# Patient Record
Sex: Female | Born: 1950 | Race: Black or African American | Hispanic: No | Marital: Single | Smoking: Former smoker
Health system: Southern US, Community
[De-identification: ages and names within clinical notes are randomized; demographics above are authoritative.]

## PROBLEM LIST (undated history)

## (undated) DIAGNOSIS — R748 Abnormal levels of other serum enzymes: Secondary | ICD-10-CM

## (undated) DIAGNOSIS — M351 Other overlap syndromes: Secondary | ICD-10-CM

## (undated) DIAGNOSIS — M1990 Unspecified osteoarthritis, unspecified site: Secondary | ICD-10-CM

## (undated) DIAGNOSIS — I10 Essential (primary) hypertension: Secondary | ICD-10-CM

## (undated) DIAGNOSIS — I7300 Raynaud's syndrome without gangrene: Secondary | ICD-10-CM

## (undated) DIAGNOSIS — G4730 Sleep apnea, unspecified: Secondary | ICD-10-CM

## (undated) DIAGNOSIS — K900 Celiac disease: Secondary | ICD-10-CM

## (undated) DIAGNOSIS — D649 Anemia, unspecified: Secondary | ICD-10-CM

## (undated) DIAGNOSIS — E669 Obesity, unspecified: Secondary | ICD-10-CM

## (undated) DIAGNOSIS — IMO0002 Reserved for concepts with insufficient information to code with codable children: Secondary | ICD-10-CM

## (undated) DIAGNOSIS — E785 Hyperlipidemia, unspecified: Secondary | ICD-10-CM

## (undated) DIAGNOSIS — M329 Systemic lupus erythematosus, unspecified: Secondary | ICD-10-CM

## (undated) HISTORY — PX: ABDOMINAL HYSTERECTOMY: SHX81

---

## 2007-07-05 ENCOUNTER — Ambulatory Visit: Payer: Self-pay

## 2008-07-11 ENCOUNTER — Ambulatory Visit: Payer: Self-pay | Admitting: Internal Medicine

## 2008-12-26 ENCOUNTER — Ambulatory Visit: Payer: Self-pay | Admitting: Internal Medicine

## 2009-04-14 ENCOUNTER — Ambulatory Visit: Payer: Self-pay | Admitting: Internal Medicine

## 2010-06-09 ENCOUNTER — Ambulatory Visit: Payer: Self-pay | Admitting: Internal Medicine

## 2010-08-23 ENCOUNTER — Ambulatory Visit: Payer: Self-pay | Admitting: Unknown Physician Specialty

## 2011-06-22 ENCOUNTER — Ambulatory Visit: Payer: Self-pay | Admitting: Internal Medicine

## 2012-06-29 ENCOUNTER — Ambulatory Visit: Payer: Self-pay | Admitting: Internal Medicine

## 2012-09-19 ENCOUNTER — Ambulatory Visit: Payer: Self-pay | Admitting: Internal Medicine

## 2012-10-22 ENCOUNTER — Ambulatory Visit: Payer: Self-pay | Admitting: Internal Medicine

## 2012-10-22 LAB — FERRITIN: Ferritin (ARMC): 209 ng/mL (ref 8–388)

## 2012-10-22 LAB — CBC CANCER CENTER
HCT: 39.1 % (ref 35.0–47.0)
Lymphocytes: 26 %
MCH: 31.5 pg (ref 26.0–34.0)
MCHC: 33.2 g/dL (ref 32.0–36.0)
MCV: 95 fL (ref 80–100)
Monocytes: 17 %
Platelet: 188 x10 3/mm (ref 150–440)
RBC: 4.12 10*6/uL (ref 3.80–5.20)
RDW: 14 % (ref 11.5–14.5)
Variant Lymphocyte: 4 %
WBC: 4.1 x10 3/mm (ref 3.6–11.0)

## 2012-10-22 LAB — IRON AND TIBC: Iron: 112 ug/dL (ref 50–170)

## 2012-10-22 LAB — RETICULOCYTES
Absolute Retic Count: 0.0473 10*6/uL (ref 0.019–0.186)
Reticulocyte: 1.15 % (ref 0.4–3.1)

## 2012-10-22 LAB — PROTIME-INR: Prothrombin Time: 13.3 secs (ref 11.5–14.7)

## 2012-10-23 LAB — PROT IMMUNOELECTROPHORES(ARMC)

## 2012-10-24 ENCOUNTER — Ambulatory Visit: Payer: Self-pay | Admitting: Internal Medicine

## 2012-11-24 ENCOUNTER — Ambulatory Visit: Payer: Self-pay | Admitting: Internal Medicine

## 2013-04-30 DIAGNOSIS — M351 Other overlap syndromes: Secondary | ICD-10-CM | POA: Diagnosis present

## 2013-04-30 DIAGNOSIS — K900 Celiac disease: Secondary | ICD-10-CM | POA: Insufficient documentation

## 2013-04-30 DIAGNOSIS — D649 Anemia, unspecified: Secondary | ICD-10-CM | POA: Insufficient documentation

## 2013-05-06 ENCOUNTER — Ambulatory Visit: Payer: Self-pay | Admitting: Internal Medicine

## 2013-05-07 LAB — CBC CANCER CENTER
BASOS ABS: 0 x10 3/mm (ref 0.0–0.1)
BASOS PCT: 0.7 %
EOS PCT: 0 %
Eosinophil #: 0 x10 3/mm (ref 0.0–0.7)
HCT: 35.3 % (ref 35.0–47.0)
HGB: 11.4 g/dL — ABNORMAL LOW (ref 12.0–16.0)
LYMPHS ABS: 1.2 x10 3/mm (ref 1.0–3.6)
LYMPHS PCT: 26.6 %
MCH: 31.1 pg (ref 26.0–34.0)
MCHC: 32.3 g/dL (ref 32.0–36.0)
MCV: 96 fL (ref 80–100)
Monocyte #: 0.9 x10 3/mm (ref 0.2–0.9)
Monocyte %: 19.5 %
Neutrophil #: 2.4 x10 3/mm (ref 1.4–6.5)
Neutrophil %: 53.2 %
PLATELETS: 191 x10 3/mm (ref 150–440)
RBC: 3.67 10*6/uL — ABNORMAL LOW (ref 3.80–5.20)
RDW: 13.9 % (ref 11.5–14.5)
WBC: 4.4 x10 3/mm (ref 3.6–11.0)

## 2013-05-24 ENCOUNTER — Ambulatory Visit: Payer: Self-pay | Admitting: Internal Medicine

## 2013-07-01 ENCOUNTER — Ambulatory Visit: Payer: Self-pay | Admitting: Internal Medicine

## 2014-05-09 ENCOUNTER — Other Ambulatory Visit: Payer: Self-pay | Admitting: Internal Medicine

## 2014-05-09 DIAGNOSIS — Z1231 Encounter for screening mammogram for malignant neoplasm of breast: Secondary | ICD-10-CM

## 2014-05-27 ENCOUNTER — Other Ambulatory Visit: Payer: Self-pay | Admitting: Rheumatology

## 2014-05-27 DIAGNOSIS — R748 Abnormal levels of other serum enzymes: Secondary | ICD-10-CM

## 2014-05-27 DIAGNOSIS — M609 Myositis, unspecified: Secondary | ICD-10-CM

## 2014-05-27 DIAGNOSIS — G729 Myopathy, unspecified: Secondary | ICD-10-CM

## 2014-07-02 ENCOUNTER — Ambulatory Visit
Admission: RE | Admit: 2014-07-02 | Discharge: 2014-07-02 | Disposition: A | Discharge status: Home / Self Care | Payer: 59 | Source: Ambulatory Visit | Attending: Rheumatology | Admitting: Rheumatology

## 2014-07-02 DIAGNOSIS — M329 Systemic lupus erythematosus, unspecified: Secondary | ICD-10-CM | POA: Diagnosis not present

## 2014-07-02 DIAGNOSIS — K5730 Diverticulosis of large intestine without perforation or abscess without bleeding: Secondary | ICD-10-CM | POA: Insufficient documentation

## 2014-07-02 DIAGNOSIS — R748 Abnormal levels of other serum enzymes: Secondary | ICD-10-CM

## 2014-07-02 DIAGNOSIS — M609 Myositis, unspecified: Secondary | ICD-10-CM

## 2014-07-02 DIAGNOSIS — G729 Myopathy, unspecified: Secondary | ICD-10-CM

## 2014-07-02 DIAGNOSIS — M25462 Effusion, left knee: Secondary | ICD-10-CM | POA: Diagnosis not present

## 2014-07-02 DIAGNOSIS — R590 Localized enlarged lymph nodes: Secondary | ICD-10-CM | POA: Insufficient documentation

## 2014-07-02 DIAGNOSIS — R7889 Finding of other specified substances, not normally found in blood: Secondary | ICD-10-CM | POA: Diagnosis present

## 2014-07-02 MED ORDER — GADOBENATE DIMEGLUMINE 529 MG/ML IV SOLN
20.0000 mL | Freq: Once | INTRAVENOUS | Status: AC | PRN
  Administered 2014-07-02: 20 mL via INTRAVENOUS

## 2014-07-04 ENCOUNTER — Ambulatory Visit: Payer: Self-pay

## 2014-07-04 ENCOUNTER — Other Ambulatory Visit: Payer: Self-pay

## 2014-07-04 ENCOUNTER — Ambulatory Visit
Admission: RE | Admit: 2014-07-04 | Discharge: 2014-07-04 | Disposition: A | Discharge status: Home / Self Care | Payer: 59 | Source: Ambulatory Visit | Attending: Internal Medicine | Admitting: Internal Medicine

## 2014-07-04 DIAGNOSIS — Z1231 Encounter for screening mammogram for malignant neoplasm of breast: Secondary | ICD-10-CM | POA: Diagnosis not present

## 2015-02-06 DIAGNOSIS — R7303 Prediabetes: Secondary | ICD-10-CM | POA: Diagnosis present

## 2015-03-10 ENCOUNTER — Other Ambulatory Visit: Payer: Self-pay | Admitting: Family Medicine

## 2015-03-10 DIAGNOSIS — Z1231 Encounter for screening mammogram for malignant neoplasm of breast: Secondary | ICD-10-CM

## 2015-07-10 ENCOUNTER — Ambulatory Visit
Admission: RE | Admit: 2015-07-10 | Discharge: 2015-07-10 | Disposition: A | Discharge status: Home / Self Care | Payer: 59 | Source: Ambulatory Visit | Attending: Family Medicine | Admitting: Family Medicine

## 2015-07-10 ENCOUNTER — Other Ambulatory Visit: Payer: Self-pay | Admitting: Family Medicine

## 2015-07-10 DIAGNOSIS — Z1231 Encounter for screening mammogram for malignant neoplasm of breast: Secondary | ICD-10-CM

## 2015-09-03 ENCOUNTER — Encounter: Payer: Self-pay | Admitting: *Deleted

## 2015-11-05 ENCOUNTER — Encounter: Payer: Self-pay | Admitting: *Deleted

## 2015-11-09 ENCOUNTER — Encounter: Payer: Self-pay | Admitting: Anesthesiology

## 2015-11-09 ENCOUNTER — Encounter
Admission: RE | Disposition: A | Discharge status: Home / Self Care | Payer: Self-pay | Source: Ambulatory Visit | Attending: Unknown Physician Specialty

## 2015-11-09 ENCOUNTER — Ambulatory Visit: Payer: 59 | Admitting: Anesthesiology

## 2015-11-09 ENCOUNTER — Ambulatory Visit
Admission: RE | Admit: 2015-11-09 | Discharge: 2015-11-09 | Disposition: A | Discharge status: Home / Self Care | Payer: 59 | Source: Ambulatory Visit | Attending: Unknown Physician Specialty | Admitting: Unknown Physician Specialty

## 2015-11-09 DIAGNOSIS — Z1211 Encounter for screening for malignant neoplasm of colon: Secondary | ICD-10-CM | POA: Insufficient documentation

## 2015-11-09 DIAGNOSIS — Z8371 Family history of colonic polyps: Secondary | ICD-10-CM | POA: Diagnosis not present

## 2015-11-09 DIAGNOSIS — Z6841 Body Mass Index (BMI) 40.0 and over, adult: Secondary | ICD-10-CM | POA: Insufficient documentation

## 2015-11-09 DIAGNOSIS — K648 Other hemorrhoids: Secondary | ICD-10-CM | POA: Insufficient documentation

## 2015-11-09 DIAGNOSIS — I739 Peripheral vascular disease, unspecified: Secondary | ICD-10-CM | POA: Diagnosis not present

## 2015-11-09 DIAGNOSIS — E669 Obesity, unspecified: Secondary | ICD-10-CM | POA: Diagnosis not present

## 2015-11-09 DIAGNOSIS — G4730 Sleep apnea, unspecified: Secondary | ICD-10-CM | POA: Insufficient documentation

## 2015-11-09 DIAGNOSIS — Z87891 Personal history of nicotine dependence: Secondary | ICD-10-CM | POA: Diagnosis not present

## 2015-11-09 DIAGNOSIS — K219 Gastro-esophageal reflux disease without esophagitis: Secondary | ICD-10-CM | POA: Diagnosis not present

## 2015-11-09 DIAGNOSIS — E785 Hyperlipidemia, unspecified: Secondary | ICD-10-CM | POA: Insufficient documentation

## 2015-11-09 DIAGNOSIS — K5730 Diverticulosis of large intestine without perforation or abscess without bleeding: Secondary | ICD-10-CM | POA: Diagnosis not present

## 2015-11-09 DIAGNOSIS — I10 Essential (primary) hypertension: Secondary | ICD-10-CM | POA: Insufficient documentation

## 2015-11-09 HISTORY — PX: COLONOSCOPY WITH PROPOFOL: SHX5780

## 2015-11-09 HISTORY — DX: Other overlap syndromes: M35.1

## 2015-11-09 HISTORY — DX: Unspecified osteoarthritis, unspecified site: M19.90

## 2015-11-09 HISTORY — DX: Hyperlipidemia, unspecified: E78.5

## 2015-11-09 HISTORY — DX: Raynaud's syndrome without gangrene: I73.00

## 2015-11-09 HISTORY — DX: Celiac disease: K90.0

## 2015-11-09 HISTORY — DX: Anemia, unspecified: D64.9

## 2015-11-09 HISTORY — DX: Sleep apnea, unspecified: G47.30

## 2015-11-09 HISTORY — DX: Obesity, unspecified: E66.9

## 2015-11-09 HISTORY — DX: Systemic lupus erythematosus, unspecified: M32.9

## 2015-11-09 HISTORY — DX: Essential (primary) hypertension: I10

## 2015-11-09 HISTORY — DX: Abnormal levels of other serum enzymes: R74.8

## 2015-11-09 HISTORY — DX: Reserved for concepts with insufficient information to code with codable children: IMO0002

## 2015-11-09 SURGERY — COLONOSCOPY WITH PROPOFOL
Anesthesia: General

## 2015-11-09 MED ORDER — SODIUM CHLORIDE 0.9 % IV SOLN: INTRAVENOUS | Status: DC

## 2015-11-09 MED ORDER — FENTANYL CITRATE (PF) 100 MCG/2ML IJ SOLN
INTRAMUSCULAR | Status: DC | PRN
  Administered 2015-11-09: 50 ug via INTRAVENOUS

## 2015-11-09 MED ORDER — PROPOFOL 500 MG/50ML IV EMUL
INTRAVENOUS | Status: DC | PRN
  Administered 2015-11-09: 120 ug/kg/min via INTRAVENOUS

## 2015-11-09 MED ORDER — PHENYLEPHRINE HCL 10 MG/ML IJ SOLN
INTRAMUSCULAR | Status: DC | PRN
  Administered 2015-11-09: 50 ug via INTRAVENOUS

## 2015-11-09 MED ORDER — SODIUM CHLORIDE 0.9 % IV SOLN
INTRAVENOUS | Status: DC
  Administered 2015-11-09: 10:00:00 via INTRAVENOUS

## 2015-11-09 MED ORDER — SODIUM CHLORIDE 0.9 % IV SOLN
INTRAVENOUS | Status: DC
  Administered 2015-11-09: 09:00:00 via INTRAVENOUS

## 2015-11-09 MED ORDER — MIDAZOLAM HCL 2 MG/2ML IJ SOLN
INTRAMUSCULAR | Status: DC | PRN
  Administered 2015-11-09: 1 mg via INTRAVENOUS

## 2015-11-09 MED ORDER — EPHEDRINE SULFATE 50 MG/ML IJ SOLN
INTRAMUSCULAR | Status: DC | PRN
  Administered 2015-11-09 (×2): 5 mg via INTRAVENOUS

## 2015-11-09 NOTE — Op Note (Signed)
[facility] Gastroenterology Patient Name: Donna Austin Procedure Date: 11/09/2015 9:39 AM MRN: [id number] Account #: [id number] Date of Birth: [birth year] Admit Type: Outpatient Age: 65 [facility] Gender: Female Note Status: Finalized Procedure:            Colonoscopy Indications:          Colon cancer screening in patient at increased risk:                        Family history of 1st-degree relative with colon polyps Providers:            Scot Junobert T. Saniyah Mondesir, MD Referring MD:         Duane LopeJeffrey D. Judithann SheenSparks, MD (Referring MD) Medicines:            Propofol per Anesthesia Complications:        No immediate complications. Procedure:            Pre-Anesthesia Assessment:                       - After reviewing the risks and benefits, the patient                        was deemed in satisfactory condition to undergo the                        procedure.                       After obtaining informed consent, the colonoscope was                        passed under direct vision. Throughout the procedure,                        the patient's blood pressure, pulse, and oxygen                        saturations were monitored continuously. The                        Colonoscope was introduced through the anus and                        advanced to the the cecum, identified by appendiceal                        orifice and ileocecal valve. The colonoscopy was                        performed without difficulty. The patient tolerated the                        procedure well. The quality of the bowel preparation                        was good. Findings:      Multiple small-mouthed diverticula were found in the sigmoid colon,       descending colon and transverse colon.      A single medium-sized angioectasia without bleeding was found in the       cecum.  Coagulation for tissue destruction using argon plasma at 0.5       liters/minute and 20 watts was successful.  For hemostasis, one       hemostatic clip was successfully placed. There was no bleeding during,       or at the end, of the procedure.      Internal hemorrhoids were found during endoscopy. The hemorrhoids were       small and Grade I (internal hemorrhoids that do not prolapse).      The exam was otherwise without abnormality.      A few medium-mouthed diverticula were found in the ascending colon. Impression:           - Diverticulosis in the sigmoid colon, in the                        descending colon and in the transverse colon.                       - A single non-bleeding colonic angioectasia. Treated                        with argon plasma coagulation (APC). Clip was placed.                       - Internal hemorrhoids.                       - The examination was otherwise normal.                       - No specimens collected. Recommendation:       - Repeat colonoscopy in 5 years for screening purposes.                        Due to family history of colon polyps. Scot Jun, MD 11/09/2015 10:29:21 AM This report has been signed electronically. Number of Addenda: 0 Note Initiated On: 11/09/2015 9:39 AM Scope Withdrawal Time: 0 hours 15 minutes 34 seconds  Total Procedure Duration: 0 hours 23 minutes 23 seconds       [facility]

## 2015-11-09 NOTE — H&P (Signed)
Primary Care Physician:  Marguarite Arbour, MD Primary Gastroenterologist:  Dr. Mechele Collin  Pre-Procedure History & Physical: HPI:  Donna Austin is a 65 y.o. female is here for an colonoscopy.   Past Medical History:  Diagnosis Date  . Anemia   . Arthritis   . Celiac disease   . Connective tissue disease overlap syndrome (HCC)   . Elevated CPK   . Hyperlipidemia   . Hypertension   . Lupus   . Obesity   . Raynaud's disease   . Raynaud's disease   . Sleep apnea     Past Surgical History:  Procedure Laterality Date  . ABDOMINAL HYSTERECTOMY      Prior to Admission medications   Medication Sig Start Date End Date Taking? Authorizing Provider  acetaminophen (TYLENOL) 650 MG suppository Place 650 mg rectally every 8 (eight) hours as needed.   Yes Historical Provider, MD  amLODipine (NORVASC) 10 MG tablet Take 10 mg by mouth daily.   Yes Historical Provider, MD  atorvastatin (LIPITOR) 10 MG tablet Take 10 mg by mouth daily.   Yes Historical Provider, MD  azaTHIOprine (IMURAN) 50 MG tablet Take 50 mg by mouth daily.   Yes Historical Provider, MD  b complex vitamins capsule Take 1 capsule by mouth daily.   Yes Historical Provider, MD  calcium carbonate (OSCAL) 1500 (600 Ca) MG TABS tablet Take 1,500 mg by mouth daily with breakfast.   Yes Historical Provider, MD  cholecalciferol (VITAMIN D) 400 units TABS tablet Take 400 Units by mouth.   Yes Historical Provider, MD  fexofenadine (ALLEGRA) 60 MG tablet Take 60 mg by mouth 2 (two) times daily.   Yes Historical Provider, MD  furosemide (LASIX) 40 MG tablet Take 40 mg by mouth.   Yes Historical Provider, MD  hydroxychloroquine (PLAQUENIL) 200 MG tablet Take 200 mg by mouth daily.   Yes Historical Provider, MD  losartan-hydrochlorothiazide (HYZAAR) 100-25 MG tablet Take 1 tablet by mouth daily.   Yes Historical Provider, MD  omega-3 acid ethyl esters (LOVAZA) 1 g capsule Take 1 g by mouth daily.   Yes Historical Provider, MD  predniSONE  (DELTASONE) 5 MG tablet Take 5 mg by mouth 2 (two) times daily with a meal.   Yes Historical Provider, MD    Allergies as of 08/05/2015  . (No Known Allergies)    Family History  Problem Relation Age of Onset  . Breast cancer Cousin   . Breast cancer Maternal Grandmother     Social History   Social History  . Marital status: Single    Spouse name: N/A  . Number of children: N/A  . Years of education: N/A   Occupational History  . Not on file.   Social History Main Topics  . Smoking status: Former Games developer  . Smokeless tobacco: Never Used  . Alcohol use Yes     Comment: occasional  . Drug use: No  . Sexual activity: Not on file   Other Topics Concern  . Not on file   Social History Narrative  . No narrative on file    Review of Systems: See HPI, otherwise negative ROS  Physical Exam: BP (!) 194/83   Pulse 93   Temp 98.3 F (36.8 C) (Tympanic)   Resp 17   Ht 5\' 10"  (1.778 m)   Wt 130.6 kg (288 lb)   SpO2 97%   BMI 41.32 kg/m  General:   Alert,  pleasant and cooperative in NAD Head:  Normocephalic and atraumatic.  Neck:  Supple; no masses or thyromegaly. Lungs:  Clear throughout to auscultation.    Heart:  Regular rate and rhythm. Abdomen:  Soft, nontender and nondistended. Normal bowel sounds, without guarding, and without rebound.   Neurologic:  Alert and  oriented x4;  grossly normal neurologically.  Impression/Plan: Donna Austin is here for an colonoscopy to be performed for screening due to FH colon polyps in father  Risks, benefits, limitations, and alternatives regarding  colonoscopy have been reviewed with the patient.  Questions have been answered.  All parties agreeable.   Lynnae PrudeELLIOTT, Rumi Kolodziej, MD  11/09/2015, 9:45 AM

## 2015-11-09 NOTE — Anesthesia Preprocedure Evaluation (Signed)
Anesthesia Evaluation  Patient identified by MRN, date of birth, ID band Patient awake    Reviewed: Allergy & Precautions, H&P , NPO status , Patient's Chart, lab work & pertinent test results  History of Anesthesia Complications Negative for: history of anesthetic complications  Airway Mallampati: III  TM Distance: <3 FB Neck ROM: limited    Dental  (+) Poor Dentition, Missing, Upper Dentures, Lower Dentures   Pulmonary neg shortness of breath, sleep apnea , former smoker,    Pulmonary exam normal breath sounds clear to auscultation       Cardiovascular Exercise Tolerance: Good hypertension, (-) angina+ Peripheral Vascular Disease  (-) Past MI and (-) DOE Normal cardiovascular exam Rhythm:regular Rate:Normal     Neuro/Psych negative neurological ROS  negative psych ROS   GI/Hepatic Neg liver ROS, GERD  Controlled,  Endo/Other  negative endocrine ROS  Renal/GU negative Renal ROS  negative genitourinary   Musculoskeletal  (+) Arthritis ,   Abdominal   Peds  Hematology negative hematology ROS (+)   Anesthesia Other Findings Past Medical History: No date: Anemia No date: Arthritis No date: Celiac disease No date: Connective tissue disease overlap syndrome (HC* No date: Elevated CPK No date: Hyperlipidemia No date: Hypertension No date: Lupus No date: Obesity No date: Raynaud's disease No date: Raynaud's disease No date: Sleep apnea  Past Surgical History: No date: ABDOMINAL HYSTERECTOMY     Reproductive/Obstetrics negative OB ROS                             Anesthesia Physical Anesthesia Plan  ASA: III  Anesthesia Plan: General   Post-op Pain Management:    Induction:   Airway Management Planned:   Additional Equipment:   Intra-op Plan:   Post-operative Plan:   Informed Consent: I have reviewed the patients History and Physical, chart, labs and discussed the  procedure including the risks, benefits and alternatives for the proposed anesthesia with the patient or authorized representative who has indicated his/her understanding and acceptance.   Dental Advisory Given  Plan Discussed with: Anesthesiologist, CRNA and Surgeon  Anesthesia Plan Comments:         Anesthesia Quick Evaluation

## 2015-11-09 NOTE — Transfer of Care (Signed)
Immediate Anesthesia Transfer of Care Note  Patient: Donna Austin  Procedure(s) Performed: Procedure(s): COLONOSCOPY WITH PROPOFOL (N/A)  Patient Location: PACU  Anesthesia Type:General  Level of Consciousness: awake and sedated  Airway & Oxygen Therapy: Patient Spontanous Breathing and Patient connected to nasal cannula oxygen  Post-op Assessment: Report given to RN and Post -op Vital signs reviewed and stable  Post vital signs: Reviewed and stable  Last Vitals:  Vitals:   11/09/15 0927  BP: (!) 194/83  Pulse: 93  Resp: 17  Temp: 36.8 C    Last Pain:  Vitals:   11/09/15 0927  TempSrc: Tympanic         Complications: No apparent anesthesia complications

## 2015-11-09 NOTE — Anesthesia Postprocedure Evaluation (Signed)
Anesthesia Post Note  Patient: Donna Austin  Procedure(s) Performed: Procedure(s) (LRB): COLONOSCOPY WITH PROPOFOL (N/A)  Patient location during evaluation: Endoscopy Anesthesia Type: General Level of consciousness: awake and alert Pain management: pain level controlled Vital Signs Assessment: post-procedure vital signs reviewed and stable Respiratory status: spontaneous breathing, nonlabored ventilation, respiratory function stable and patient connected to nasal cannula oxygen Cardiovascular status: blood pressure returned to baseline and stable Postop Assessment: no signs of nausea or vomiting Anesthetic complications: no    Last Vitals:  Vitals:   11/09/15 1051 11/09/15 1101  BP: 120/64 139/71  Pulse: 81   Resp: 14 14  Temp:      Last Pain:  Vitals:   11/09/15 1031  TempSrc: Tympanic                 Cleda MccreedyJoseph K Everado Pillsbury

## 2015-11-09 NOTE — Anesthesia Procedure Notes (Signed)
Performed by: COOK-MARTIN, Lanson Randle Pre-anesthesia Checklist: Patient identified, Emergency Drugs available, Suction available, Patient being monitored and Timeout performed Patient Re-evaluated:Patient Re-evaluated prior to inductionOxygen Delivery Method: Simple face mask Preoxygenation: Pre-oxygenation with 100% oxygen Intubation Type: IV induction Placement Confirmation: CO2 detector and positive ETCO2       

## 2015-11-09 NOTE — Op Note (Addendum)
[facility] Gastroenterology Patient Name: Donna BillJanie Austin Procedure Date: 11/09/2015 8:54 AM MRN: [id number] Account #: [id number] Date of Birth: 06/30/1950 Admit Type: Outpatient Age: [age] [facility] Gender: Female Note Status: Finalized THIS EXAM WAS SENT IN ERROR

## 2015-11-14 ENCOUNTER — Encounter: Payer: Self-pay | Admitting: Unknown Physician Specialty

## 2016-05-24 ENCOUNTER — Other Ambulatory Visit: Payer: Self-pay | Admitting: Family Medicine

## 2016-05-24 DIAGNOSIS — Z780 Asymptomatic menopausal state: Secondary | ICD-10-CM

## 2016-06-14 ENCOUNTER — Other Ambulatory Visit: Payer: Self-pay | Admitting: Family Medicine

## 2016-06-14 DIAGNOSIS — Z1231 Encounter for screening mammogram for malignant neoplasm of breast: Secondary | ICD-10-CM

## 2016-08-09 ENCOUNTER — Ambulatory Visit
Admission: RE | Admit: 2016-08-09 | Discharge: 2016-08-09 | Disposition: A | Discharge status: Home / Self Care | Payer: 59 | Source: Ambulatory Visit | Attending: Family Medicine | Admitting: Family Medicine

## 2016-08-09 DIAGNOSIS — Z780 Asymptomatic menopausal state: Secondary | ICD-10-CM | POA: Insufficient documentation

## 2016-08-09 DIAGNOSIS — Z1231 Encounter for screening mammogram for malignant neoplasm of breast: Secondary | ICD-10-CM

## 2017-05-25 ENCOUNTER — Other Ambulatory Visit: Payer: Self-pay | Admitting: Family Medicine

## 2017-05-25 DIAGNOSIS — Z1239 Encounter for other screening for malignant neoplasm of breast: Secondary | ICD-10-CM

## 2017-05-25 DIAGNOSIS — I890 Lymphedema, not elsewhere classified: Secondary | ICD-10-CM

## 2017-08-28 ENCOUNTER — Ambulatory Visit
Admission: RE | Admit: 2017-08-28 | Discharge: 2017-08-28 | Disposition: A | Discharge status: Home / Self Care | Payer: 59 | Source: Ambulatory Visit | Attending: Family Medicine | Admitting: Family Medicine

## 2017-08-28 DIAGNOSIS — Z1239 Encounter for other screening for malignant neoplasm of breast: Secondary | ICD-10-CM

## 2017-08-28 DIAGNOSIS — Z1231 Encounter for screening mammogram for malignant neoplasm of breast: Secondary | ICD-10-CM | POA: Insufficient documentation

## 2018-01-05 DIAGNOSIS — G4733 Obstructive sleep apnea (adult) (pediatric): Secondary | ICD-10-CM

## 2018-07-05 ENCOUNTER — Other Ambulatory Visit: Payer: Self-pay | Admitting: Family Medicine

## 2018-07-05 DIAGNOSIS — Z1231 Encounter for screening mammogram for malignant neoplasm of breast: Secondary | ICD-10-CM

## 2018-08-30 ENCOUNTER — Ambulatory Visit
Admission: RE | Admit: 2018-08-30 | Discharge: 2018-08-30 | Disposition: A | Discharge status: Home / Self Care | Payer: 59 | Source: Ambulatory Visit | Attending: Family Medicine | Admitting: Family Medicine

## 2018-08-30 DIAGNOSIS — Z1231 Encounter for screening mammogram for malignant neoplasm of breast: Secondary | ICD-10-CM | POA: Insufficient documentation

## 2019-03-24 ENCOUNTER — Ambulatory Visit: Payer: 59 | Attending: Internal Medicine

## 2019-03-24 DIAGNOSIS — Z23 Encounter for immunization: Secondary | ICD-10-CM

## 2019-03-24 NOTE — Progress Notes (Signed)
   Covid-19 Vaccination Clinic  Name:  Donna Austin    MRN: [id number] DOB: 06-20-1950  03/24/2019  Donna Austin was observed post Covid-19 immunization for 15 minutes without incidence. She was provided with Vaccine Information Sheet and instruction to access the V-Safe system.   Donna Austin was instructed to call 911 with any severe reactions post vaccine: Marland Kitchen Difficulty breathing  . Swelling of your face and throat  . A fast heartbeat  . A bad rash all over your body  . Dizziness and weakness    Immunizations Administered    Name Date Dose VIS Date Route   Pfizer COVID-19 Vaccine 03/24/2019  2:53 PM 0.3 mL 01/04/2019 Intramuscular   Manufacturer: [employer], [employer]   Lot: [id number]   NDC: [address]-5

## 2019-04-16 ENCOUNTER — Ambulatory Visit: Payer: 59 | Attending: Internal Medicine

## 2019-04-16 DIAGNOSIS — Z23 Encounter for immunization: Secondary | ICD-10-CM

## 2019-04-16 NOTE — Progress Notes (Signed)
    Covid-19 Vaccination Clinic  Name:  TRINIDI TOPPINS    MRN: [id number] DOB: 1950-09-16  04/16/2019  Ms. Boreman was observed post Covid-19 immunization for  without incident. She was provided with Vaccine Information Sheet and instruction to access the V-Safe system.   Ms. Ballentine was instructed to call 911 with any severe reactions post vaccine: Marland Kitchen Difficulty breathing  . Swelling of face and throat  . A fast heartbeat  . A bad rash all over body  . Dizziness and weakness   Immunizations Administered    Name Date Dose VIS Date Route   Pfizer COVID-19 Vaccine 04/16/2019  1:07 PM 0.3 mL 01/04/2019 Intramuscular   Manufacturer: [employer], [employer]   Lot: [id number]   NDC: [address]-1

## 2019-08-26 ENCOUNTER — Other Ambulatory Visit: Payer: Self-pay | Admitting: Family Medicine

## 2019-08-26 DIAGNOSIS — Z1231 Encounter for screening mammogram for malignant neoplasm of breast: Secondary | ICD-10-CM

## 2019-09-17 ENCOUNTER — Other Ambulatory Visit: Payer: Self-pay

## 2019-09-17 ENCOUNTER — Ambulatory Visit
Admission: RE | Admit: 2019-09-17 | Discharge: 2019-09-17 | Disposition: A | Discharge status: Home / Self Care | Payer: 59 | Source: Ambulatory Visit | Attending: Family Medicine | Admitting: Family Medicine

## 2019-09-17 DIAGNOSIS — Z1231 Encounter for screening mammogram for malignant neoplasm of breast: Secondary | ICD-10-CM | POA: Insufficient documentation

## 2020-04-07 ENCOUNTER — Other Ambulatory Visit: Payer: Self-pay | Admitting: Pulmonary Disease

## 2020-04-07 DIAGNOSIS — M351 Other overlap syndromes: Secondary | ICD-10-CM

## 2020-04-14 ENCOUNTER — Ambulatory Visit: Payer: 59

## 2020-04-23 ENCOUNTER — Other Ambulatory Visit: Payer: Self-pay

## 2020-04-23 ENCOUNTER — Ambulatory Visit
Admission: RE | Admit: 2020-04-23 | Discharge: 2020-04-23 | Disposition: A | Discharge status: Home / Self Care | Payer: 59 | Source: Ambulatory Visit | Attending: Pulmonary Disease | Admitting: Pulmonary Disease

## 2020-04-23 DIAGNOSIS — M351 Other overlap syndromes: Secondary | ICD-10-CM | POA: Diagnosis not present

## 2020-05-21 ENCOUNTER — Other Ambulatory Visit
Admission: RE | Admit: 2020-05-21 | Discharge: 2020-05-21 | Disposition: A | Discharge status: Home / Self Care | Payer: 59 | Source: Ambulatory Visit | Attending: Student | Admitting: Student

## 2020-05-21 DIAGNOSIS — I10 Essential (primary) hypertension: Secondary | ICD-10-CM | POA: Diagnosis not present

## 2020-05-21 DIAGNOSIS — Z79899 Other long term (current) drug therapy: Secondary | ICD-10-CM | POA: Insufficient documentation

## 2020-05-21 DIAGNOSIS — E785 Hyperlipidemia, unspecified: Secondary | ICD-10-CM | POA: Insufficient documentation

## 2020-05-21 LAB — BRAIN NATRIURETIC PEPTIDE: B Natriuretic Peptide: 33.6 pg/mL (ref 0.0–100.0)

## 2020-05-26 ENCOUNTER — Other Ambulatory Visit: Payer: Self-pay

## 2020-05-26 ENCOUNTER — Other Ambulatory Visit
Admission: RE | Admit: 2020-05-26 | Discharge: 2020-05-26 | Disposition: A | Discharge status: Home / Self Care | Payer: 59 | Source: Ambulatory Visit | Attending: Internal Medicine | Admitting: Internal Medicine

## 2020-05-26 DIAGNOSIS — Z20822 Contact with and (suspected) exposure to covid-19: Secondary | ICD-10-CM | POA: Diagnosis not present

## 2020-05-26 DIAGNOSIS — Z01812 Encounter for preprocedural laboratory examination: Secondary | ICD-10-CM | POA: Insufficient documentation

## 2020-05-26 LAB — SARS CORONAVIRUS 2 (TAT 6-24 HRS): SARS Coronavirus 2: NEGATIVE

## 2020-05-28 DIAGNOSIS — R079 Chest pain, unspecified: Secondary | ICD-10-CM

## 2020-05-28 DIAGNOSIS — I272 Pulmonary hypertension, unspecified: Secondary | ICD-10-CM

## 2020-06-09 ENCOUNTER — Inpatient Hospital Stay: Admission: RE | Admit: 2020-06-09 | Payer: 59 | Source: Ambulatory Visit

## 2020-06-10 ENCOUNTER — Encounter: Payer: Self-pay | Admitting: Internal Medicine

## 2020-06-10 ENCOUNTER — Other Ambulatory Visit: Payer: Self-pay

## 2020-06-10 ENCOUNTER — Ambulatory Visit
Admission: RE | Admit: 2020-06-10 | Discharge: 2020-06-10 | Disposition: A | Discharge status: Home / Self Care | Payer: 59 | Attending: Internal Medicine | Admitting: Internal Medicine

## 2020-06-10 ENCOUNTER — Encounter
Admission: RE | Disposition: A | Discharge status: Home / Self Care | Payer: Self-pay | Source: Home / Self Care | Attending: Internal Medicine

## 2020-06-10 DIAGNOSIS — R0789 Other chest pain: Secondary | ICD-10-CM | POA: Diagnosis not present

## 2020-06-10 DIAGNOSIS — I700 Atherosclerosis of aorta: Secondary | ICD-10-CM | POA: Insufficient documentation

## 2020-06-10 DIAGNOSIS — I272 Pulmonary hypertension, unspecified: Secondary | ICD-10-CM

## 2020-06-10 DIAGNOSIS — R079 Chest pain, unspecified: Secondary | ICD-10-CM

## 2020-06-10 HISTORY — PX: RIGHT/LEFT HEART CATH AND CORONARY ANGIOGRAPHY: CATH118266

## 2020-06-10 SURGERY — RIGHT/LEFT HEART CATH AND CORONARY ANGIOGRAPHY
Anesthesia: Moderate Sedation

## 2020-06-10 MED ORDER — IOHEXOL 300 MG/ML  SOLN
INTRAMUSCULAR | Status: DC | PRN
  Administered 2020-06-10: 70 mL

## 2020-06-10 MED ORDER — SODIUM CHLORIDE 0.9% FLUSH: 3.0000 mL | Freq: Two times a day (BID) | INTRAVENOUS | Status: DC

## 2020-06-10 MED ORDER — LIDOCAINE HCL (PF) 1 % IJ SOLN
INTRAMUSCULAR | Status: DC | PRN
  Administered 2020-06-10: 30 mL

## 2020-06-10 MED ORDER — SODIUM CHLORIDE 0.9 % IV SOLN
250.0000 mL | INTRAVENOUS | Status: DC | PRN
  Administered 2020-06-10: 1000 mL via INTRAVENOUS

## 2020-06-10 MED ORDER — LABETALOL HCL 5 MG/ML IV SOLN: 10.0000 mg | INTRAVENOUS | Status: DC | PRN

## 2020-06-10 MED ORDER — MIDAZOLAM HCL 2 MG/2ML IJ SOLN
INTRAMUSCULAR | Status: DC | PRN
  Administered 2020-06-10: 1 mg via INTRAVENOUS

## 2020-06-10 MED ORDER — FENTANYL CITRATE (PF) 100 MCG/2ML IJ SOLN
INTRAMUSCULAR | Status: AC
  Filled 2020-06-10: qty 2

## 2020-06-10 MED ORDER — SODIUM CHLORIDE 0.9 % IV SOLN: 250.0000 mL | INTRAVENOUS | Status: DC | PRN

## 2020-06-10 MED ORDER — ONDANSETRON HCL 4 MG/2ML IJ SOLN: 4.0000 mg | Freq: Four times a day (QID) | INTRAMUSCULAR | Status: DC | PRN

## 2020-06-10 MED ORDER — MIDAZOLAM HCL 2 MG/2ML IJ SOLN
INTRAMUSCULAR | Status: AC
  Filled 2020-06-10: qty 2

## 2020-06-10 MED ORDER — SODIUM CHLORIDE 0.9% FLUSH: 3.0000 mL | INTRAVENOUS | Status: DC | PRN

## 2020-06-10 MED ORDER — SODIUM CHLORIDE 0.9 % WEIGHT BASED INFUSION: 3.0000 mL/kg/h | INTRAVENOUS | Status: AC

## 2020-06-10 MED ORDER — ACETAMINOPHEN 325 MG PO TABS: 650.0000 mg | ORAL_TABLET | ORAL | Status: DC | PRN

## 2020-06-10 MED ORDER — HYDRALAZINE HCL 20 MG/ML IJ SOLN: 10.0000 mg | INTRAMUSCULAR | Status: DC | PRN

## 2020-06-10 MED ORDER — SODIUM CHLORIDE 0.9 % WEIGHT BASED INFUSION: 1.0000 mL/kg/h | INTRAVENOUS | Status: DC

## 2020-06-10 MED ORDER — ASPIRIN 81 MG PO CHEW: 81.0000 mg | CHEWABLE_TABLET | ORAL | Status: AC

## 2020-06-10 MED ORDER — ASPIRIN 81 MG PO CHEW
CHEWABLE_TABLET | ORAL | Status: AC
  Administered 2020-06-10: 81 mg via ORAL
  Filled 2020-06-10: qty 1

## 2020-06-10 MED ORDER — HEPARIN (PORCINE) IN NACL 1000-0.9 UT/500ML-% IV SOLN
INTRAVENOUS | Status: AC
  Filled 2020-06-10: qty 1000

## 2020-06-10 MED ORDER — FENTANYL CITRATE (PF) 100 MCG/2ML IJ SOLN
INTRAMUSCULAR | Status: DC | PRN
  Administered 2020-06-10: 25 ug via INTRAVENOUS

## 2020-06-10 MED ORDER — HEPARIN (PORCINE) IN NACL 2000-0.9 UNIT/L-% IV SOLN
INTRAVENOUS | Status: DC | PRN
  Administered 2020-06-10: 1000 mL

## 2020-06-10 SURGICAL SUPPLY — 13 items
CATH INFINITI 5FR JL4 (CATHETERS) ×2 IMPLANT
CATH INFINITI JR4 5F (CATHETERS) ×2 IMPLANT
CATH SWAN GANZ 7F STRAIGHT (CATHETERS) ×2 IMPLANT
DEVICE CLOSURE MYNXGRIP 5F (Vascular Products) ×2 IMPLANT
NEEDLE PERC 18GX7CM (NEEDLE) ×2 IMPLANT
PACK CARDIAC CATH (CUSTOM PROCEDURE TRAY) ×2 IMPLANT
PANNUS RETENTION SYSTEM 2 PAD (MISCELLANEOUS) ×2 IMPLANT
PROTECTION STATION PRESSURIZED (MISCELLANEOUS) ×2
SET ATX SIMPLICITY (MISCELLANEOUS) ×2 IMPLANT
SHEATH AVANTI 5FR X 11CM (SHEATH) ×2 IMPLANT
SHEATH AVANTI 7FRX11 (SHEATH) ×2 IMPLANT
STATION PROTECTION PRESSURIZED (MISCELLANEOUS) ×1 IMPLANT
WIRE GUIDERIGHT .035X150 (WIRE) ×2 IMPLANT

## 2020-06-11 ENCOUNTER — Encounter: Payer: Self-pay | Admitting: Internal Medicine

## 2020-06-17 ENCOUNTER — Encounter: Payer: Self-pay | Admitting: Registered Nurse

## 2020-08-19 ENCOUNTER — Other Ambulatory Visit (HOSPITAL_COMMUNITY): Payer: Self-pay | Admitting: Family Medicine

## 2020-08-19 ENCOUNTER — Other Ambulatory Visit: Payer: Self-pay | Admitting: Family Medicine

## 2020-08-19 DIAGNOSIS — R222 Localized swelling, mass and lump, trunk: Secondary | ICD-10-CM

## 2020-08-25 ENCOUNTER — Inpatient Hospital Stay: Payer: 59

## 2020-08-25 ENCOUNTER — Encounter: Payer: Self-pay | Admitting: Oncology

## 2020-08-25 ENCOUNTER — Inpatient Hospital Stay: Payer: 59 | Attending: Oncology | Admitting: Oncology

## 2020-08-25 VITALS — BP 133/76 | HR 78 | Temp 96.7°F | Resp 16 | Wt 276.0 lb

## 2020-08-25 DIAGNOSIS — D72819 Decreased white blood cell count, unspecified: Secondary | ICD-10-CM

## 2020-08-25 DIAGNOSIS — M329 Systemic lupus erythematosus, unspecified: Secondary | ICD-10-CM | POA: Diagnosis not present

## 2020-08-25 DIAGNOSIS — Z79899 Other long term (current) drug therapy: Secondary | ICD-10-CM | POA: Diagnosis not present

## 2020-08-25 DIAGNOSIS — R591 Generalized enlarged lymph nodes: Secondary | ICD-10-CM | POA: Insufficient documentation

## 2020-08-25 DIAGNOSIS — I7300 Raynaud's syndrome without gangrene: Secondary | ICD-10-CM | POA: Insufficient documentation

## 2020-08-25 DIAGNOSIS — E538 Deficiency of other specified B group vitamins: Secondary | ICD-10-CM | POA: Diagnosis not present

## 2020-08-25 DIAGNOSIS — E785 Hyperlipidemia, unspecified: Secondary | ICD-10-CM

## 2020-08-25 DIAGNOSIS — G4730 Sleep apnea, unspecified: Secondary | ICD-10-CM

## 2020-08-25 DIAGNOSIS — Z7952 Long term (current) use of systemic steroids: Secondary | ICD-10-CM | POA: Insufficient documentation

## 2020-08-25 DIAGNOSIS — D539 Nutritional anemia, unspecified: Secondary | ICD-10-CM

## 2020-08-25 LAB — CBC WITH DIFFERENTIAL/PLATELET
Abs Immature Granulocytes: 0.01 10*3/uL (ref 0.00–0.07)
Basophils Absolute: 0 10*3/uL (ref 0.0–0.1)
Basophils Relative: 0 %
Eosinophils Absolute: 0 10*3/uL (ref 0.0–0.5)
Eosinophils Relative: 1 %
HCT: 31.4 % — ABNORMAL LOW (ref 36.0–46.0)
Hemoglobin: 9.8 g/dL — ABNORMAL LOW (ref 12.0–15.0)
Immature Granulocytes: 0 %
Lymphocytes Relative: 9 %
Lymphs Abs: 0.4 10*3/uL — ABNORMAL LOW (ref 0.7–4.0)
MCH: 31.5 pg (ref 26.0–34.0)
MCHC: 31.2 g/dL (ref 30.0–36.0)
MCV: 101 fL — ABNORMAL HIGH (ref 80.0–100.0)
Monocytes Absolute: 0.7 10*3/uL (ref 0.1–1.0)
Monocytes Relative: 19 %
Neutro Abs: 2.7 10*3/uL (ref 1.7–7.7)
Neutrophils Relative %: 71 %
Platelets: 269 10*3/uL (ref 150–400)
RBC: 3.11 MIL/uL — ABNORMAL LOW (ref 3.87–5.11)
RDW: 14.6 % (ref 11.5–15.5)
WBC: 3.8 10*3/uL — ABNORMAL LOW (ref 4.0–10.5)
nRBC: 0 % (ref 0.0–0.2)

## 2020-08-25 LAB — RETIC PANEL
Immature Retic Fract: 10.2 % (ref 2.3–15.9)
RBC.: 3.11 MIL/uL — ABNORMAL LOW (ref 3.87–5.11)
Retic Count, Absolute: 51 10*3/uL (ref 19.0–186.0)
Retic Ct Pct: 1.6 % (ref 0.4–3.1)
Reticulocyte Hemoglobin: 34.3 pg (ref >27.9)

## 2020-08-25 LAB — VITAMIN B12: Vitamin B-12: 218 pg/mL (ref 180–914)

## 2020-08-25 LAB — FOLATE: Folate: 11 ng/mL (ref >5.9)

## 2020-08-25 NOTE — Progress Notes (Signed)
Pt referred by Dr Greggory Stallion for low WBC and enlarged lymph nodes.

## 2020-08-25 NOTE — Progress Notes (Signed)
Hematology/Oncology Consult note [facility] [phone] [phone]   Patient Care Team: Sharyne Peach, MD as PCP - General (Family Medicine)  REFERRING PROVIDER: Sharyne Peach, MD  CHIEF COMPLAINTS/REASON FOR VISIT:  Evaluation of pia and enlarged lymph node.  HISTORY OF PRESENTING ILLNESS:   Donna Austin is a  70 y.o.  female with PMH listed below was seen in consultation at the request of  Sharyne Peach, MD  for evaluation of leukopenia and a large lymph node  Patient has history of mixed connective tissue disease, discoid lupus, Raynaud's disease, osteoarthritis and follows up with Dr. Jefm Bryant.  Patient was previously on methotrexate treatment which was stopped due to neutropenia.  She is currently on Imuran, low-dose steroids and Plaquenil.  Patient had a CT chest done in February 2022 for evaluation of dyspnea on exertion. 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  No definite acute pulmonary abnormality.  Severe atherosclerosis.  Numerous lymph nodes are slightly more prominent in the number and a size 10 typically seen but nonspecific.  Patient also had a blood work done on 08/11/2020. CBC showed hemoglobin 10.3, MCV 99, platelet count 287, white count 5.4. Differential showed decreased absolute lymphocytes 0.46, increased monocyte percentage 19.4, decreased eosinophil percentage.  Normal ANC and neutrophil percentage. Patient denies any unintentional weight loss, fever, severe night sweats.  She endorses intermittent hot flash and sweating.  Review of Systems  Constitutional:  Negative for appetite change, chills, fatigue and fever.  HENT:   Negative for hearing loss and voice change.   Eyes:  Negative for eye problems.  Respiratory:  Negative for chest tightness and cough.   Cardiovascular:  Negative for chest pain.  Gastrointestinal:  Negative  for abdominal distention, abdominal pain and blood in stool.  Genitourinary:  Negative for difficulty urinating and frequency.   Musculoskeletal:  Negative for arthralgias.  Skin:  Negative for itching and rash.  Neurological:  Negative for extremity weakness.  Hematological:  Negative for adenopathy.  Psychiatric/Behavioral:  Negative for confusion.    MEDICAL HISTORY:  Past Medical History:  Diagnosis Date   Anemia    Arthritis    Celiac disease    Connective tissue disease overlap syndrome (HCC)    Elevated CPK    Hyperlipidemia    Hypertension    Lupus ([place])    Obesity    Raynaud's disease    Raynaud's disease    Sleep apnea     SURGICAL HISTORY: Past Surgical History:  Procedure Laterality Date   ABDOMINAL HYSTERECTOMY     COLONOSCOPY WITH PROPOFOL N/A 11/09/2015   Procedure: COLONOSCOPY WITH PROPOFOL;  Surgeon: Manya Silvas, MD;  Location: [facility] ENDOSCOPY;  Service: Endoscopy;  Laterality: N/A;   RIGHT/LEFT HEART CATH AND CORONARY ANGIOGRAPHY N/A 06/10/2020   Procedure: RIGHT/LEFT HEART CATH AND CORONARY ANGIOGRAPHY;  Surgeon: Yolonda Kida, MD;  Location: [place] [facility];  Service: Cardiovascular;  Laterality: N/A;    SOCIAL HISTORY: Patient lives in [place].  She works as a Marketing executive for a company.  Plans to retire. Social History   Socioeconomic History   Marital status: Single    Spouse name: Not on file   Number of children: 1   Years of education: Not on file   Highest education level: Not on file  Occupational History   Not on file  Tobacco Use   Smoking status: Former   Smokeless tobacco:  Never  Vaping Use   Vaping Use: Never used  Substance and Sexual Activity   Alcohol use: Yes    Comment: occasional   Drug use: No   Sexual activity: Not on file  Other Topics Concern   Not on file  Social History Narrative   Lives by herself. Son lives in [place] & sees pt. Often. 3 grandchildren    Social Determinants of Adult nurse Strain: Not on file  Food Insecurity: Not on file  Transportation Needs: Not on file  Physical Activity: Not on file  Stress: Not on file  Social Connections: Not on file  Intimate Partner Violence: Not on file    FAMILY HISTORY: Family History  Problem Relation Age of Onset   Breast cancer Cousin    Breast cancer Maternal Grandmother    Breast cancer Sister 64    ALLERGIES:  is allergic to methotrexate derivatives, other, and latex.  MEDICATIONS:  Current Outpatient Medications  Medication Sig Dispense Refill   acetaminophen (TYLENOL) 650 MG CR tablet Take 1,950 mg by mouth at bedtime as needed for pain.     amLODipine (NORVASC) 10 MG tablet Take 10 mg by mouth in the morning.     aspirin EC 81 MG tablet Take 81 mg by mouth in the morning. Swallow whole.     atorvastatin (LIPITOR) 20 MG tablet Take 20 mg by mouth every Monday, Wednesday, and Friday at 8 PM.     azaTHIOprine (IMURAN) 50 MG tablet Take 150 mg by mouth in the morning.     beta carotene 25000 UNIT capsule Take 25,000 Units by mouth in the morning.     Cholecalciferol (VITAMIN D3) 50 MCG (2000 UT) TABS Take 2,000 Units by mouth in the morning.     fexofenadine (ALLEGRA) 180 MG tablet Take 180 mg by mouth daily as needed for allergies or rhinitis.     fluticasone (FLONASE) 50 MCG/ACT nasal spray Place 1 spray into both nostrils daily as needed for allergies or rhinitis.     furosemide (LASIX) 20 MG tablet Take 20 mg by mouth daily as needed (fluid retention).     hydrALAZINE (APRESOLINE) 50 MG tablet Take 100 mg by mouth in the morning.     hydrochlorothiazide (HYDRODIURIL) 25 MG tablet Take 25 mg by mouth in the morning.     hydroxychloroquine (PLAQUENIL) 200 MG tablet Take 200 mg by mouth in the morning.     irbesartan (AVAPRO) 300 MG tablet Take 300 mg by mouth in the morning.     Menthol (ICY HOT BACK) 5 % PTCH Place 1 patch onto the skin daily as needed (pain.).     pantoprazole (PROTONIX)  20 MG tablet Take 20 mg by mouth daily before breakfast.     predniSONE (DELTASONE) 5 MG tablet Take 5 mg by mouth in the morning.     tacrolimus (PROTOPIC) 0.03 % ointment Apply 1 application topically 2 (two) times daily as needed (lupus sarcoidosis).     vitamin C (ASCORBIC ACID) 500 MG tablet Take 500 mg by mouth in the morning.     vitamin E 180 MG (400 UNITS) capsule Take 400 Units by mouth in the morning.     augmented betamethasone dipropionate (DIPROLENE-AF) 0.05 % cream Apply 1 application topically 2 (two) times daily as needed (lupus sarcoidosis). (Patient not taking: Reported on 08/25/2020)     No current facility-administered medications for this visit.     PHYSICAL EXAMINATION: ECOG PERFORMANCE STATUS: 1 -  Symptomatic but completely ambulatory Vitals:   08/25/20 1500  BP: 133/76  Pulse: 78  Resp: 16  Temp: (!) 96.7 F (35.9 C)  SpO2: 100%   Filed Weights   08/25/20 1500  Weight: 276 lb (125.2 kg)    Physical Exam Constitutional:      General: She is not in acute distress.    Appearance: She is obese.  HENT:     Head: Normocephalic and atraumatic.  Eyes:     General: No scleral icterus. Cardiovascular:     Rate and Rhythm: Normal rate and regular rhythm.     Heart sounds: Normal heart sounds.  Pulmonary:     Effort: Pulmonary effort is normal. No respiratory distress.     Breath sounds: No wheezing.  Abdominal:     General: Bowel sounds are normal. There is no distension.     Palpations: Abdomen is soft.  Musculoskeletal:        General: No deformity. Normal range of motion.     Cervical back: Normal range of motion and neck supple.  Skin:    General: Skin is warm and dry.     Findings: No erythema or rash.  Neurological:     Mental Status: She is alert and oriented to person, place, and time. Mental status is at baseline.     Cranial Nerves: No cranial nerve deficit.     Coordination: Coordination normal.  Psychiatric:        Mood and Affect: Mood  normal.    LABORATORY DATA:  I have reviewed the data as listed Lab Results  Component Value Date   WBC 3.8 (L) 08/25/2020   HGB 9.8 (L) 08/25/2020   HCT 31.4 (L) 08/25/2020   MCV 101.0 (H) 08/25/2020   PLT 269 08/25/2020   No results for input(s): NA, K, CL, CO2, GLUCOSE, BUN, CREATININE, CALCIUM, GFRNONAA, GFRAA, PROT, ALBUMIN, AST, ALT, ALKPHOS, BILITOT, BILIDIR, IBILI in the last 8760 hours. Iron/TIBC/Ferritin/ %Sat    Component Value Date/Time   IRON 112 10/22/2012 1604   TIBC 307 10/22/2012 1604   FERRITIN 209 10/22/2012 1604   IRONPCTSAT 36 10/22/2012 1604      RADIOGRAPHIC STUDIES: I have personally reviewed the radiological images as listed and agreed with the findings in the report. No results found.    ASSESSMENT & PLAN:  1. Lymphadenopathy   2. Leukopenia, unspecified type   3. Macrocytic anemia   4. Vitamin B12 deficiency    #Leukopenia, nonspecific lymphadenopathy on recent CT scan in February 2022. Discussed with patient that both leukopenia and lymphadenopathy can be seen in autoimmune disease.  Patient has extensive autoimmune disease including Raynaud's disease, mixed connective tissue disease. Cytopenia can be also a result of medication side effects-i.e. Imuran.  Rule out other etiologies Check CBC, smear, flow cytometry, protein interfaces and light chain ratio, B12 and folate level.  Will obtain images from [place] for future comparison.  Consider repeat CT scan for follow-up of nonspecific lymphadenopathy.  She agrees with the plan.  Borderline vitamin B12 level, recommend patient to start oral vitamin B12 supplementation. Macrocytic anemia, may be related to borderline B12 level, Imuran causes macrocytosis. Imuran can cause anemia although less frequently.    Orders Placed This Encounter  Procedures   Vitamin B12    Standing Status:   Future    Number of Occurrences:   1    Standing Expiration Date:   08/25/2021   Folate    Standing Status:    Future  Number of Occurrences:   1    Standing Expiration Date:   08/25/2021   CBC with Differential/Platelet    Standing Status:   Future    Number of Occurrences:   1    Standing Expiration Date:   08/25/2021   Retic Panel    Standing Status:   Future    Number of Occurrences:   1    Standing Expiration Date:   08/25/2021   Multiple Myeloma Panel (SPEP&IFE w/QIG)    Standing Status:   Future    Number of Occurrences:   1    Standing Expiration Date:   08/25/2021   Kappa/lambda light chains    Standing Status:   Future    Number of Occurrences:   1    Standing Expiration Date:   08/25/2021   Flow cytometry panel-leukemia/lymphoma work-up    Standing Status:   Future    Number of Occurrences:   1    Standing Expiration Date:   08/25/2021    All questions were answered. The patient knows to call the clinic with any problems questions or concerns.  cc Sharyne Peach, MD    Return of visit: To be determined. Thank you for this kind referral and the opportunity to participate in the care of this patient. A copy of today's note is routed to referring provider    Earlie Server, MD, PhD Hematology Oncology [facility] Pager- [phone] 08/25/2020

## 2020-08-26 ENCOUNTER — Other Ambulatory Visit: Payer: Self-pay

## 2020-08-26 ENCOUNTER — Ambulatory Visit
Admission: RE | Admit: 2020-08-26 | Discharge: 2020-08-26 | Disposition: A | Discharge status: Home / Self Care | Payer: Self-pay | Source: Ambulatory Visit | Attending: Oncology | Admitting: Oncology

## 2020-08-26 DIAGNOSIS — R591 Generalized enlarged lymph nodes: Secondary | ICD-10-CM

## 2020-08-26 LAB — KAPPA/LAMBDA LIGHT CHAINS
Kappa free light chain: 55.9 mg/L — ABNORMAL HIGH (ref 3.3–19.4)
Kappa, lambda light chain ratio: 1.36 (ref 0.26–1.65)
Lambda free light chains: 41.2 mg/L — ABNORMAL HIGH (ref 5.7–26.3)

## 2020-08-27 ENCOUNTER — Telehealth: Payer: Self-pay

## 2020-08-27 LAB — MULTIPLE MYELOMA PANEL, SERUM
Albumin SerPl Elph-Mcnc: 3.5 g/dL (ref 2.9–4.4)
Albumin/Glob SerPl: 0.9 (ref 0.7–1.7)
Alpha 1: 0.2 g/dL (ref 0.0–0.4)
Alpha2 Glob SerPl Elph-Mcnc: 1 g/dL (ref 0.4–1.0)
B-Globulin SerPl Elph-Mcnc: 1.1 g/dL (ref 0.7–1.3)
Gamma Glob SerPl Elph-Mcnc: 1.7 g/dL (ref 0.4–1.8)
Globulin, Total: 4.1 g/dL — ABNORMAL HIGH (ref 2.2–3.9)
IgA: 590 mg/dL — ABNORMAL HIGH (ref 87–352)
IgG (Immunoglobin G), Serum: 1973 mg/dL — ABNORMAL HIGH (ref 586–1602)
IgM (Immunoglobulin M), Srm: 58 mg/dL (ref 26–217)
Total Protein ELP: 7.6 g/dL (ref 6.0–8.5)

## 2020-08-27 NOTE — Telephone Encounter (Signed)
Images from CT chest done at [facility] on 03/21/20 has been powershared to [place] system.

## 2020-08-28 LAB — COMP PANEL: LEUKEMIA/LYMPHOMA

## 2020-09-01 ENCOUNTER — Other Ambulatory Visit: Payer: Self-pay | Admitting: Family Medicine

## 2020-09-01 DIAGNOSIS — Z1231 Encounter for screening mammogram for malignant neoplasm of breast: Secondary | ICD-10-CM

## 2020-09-08 ENCOUNTER — Encounter: Payer: Self-pay | Admitting: Nephrology

## 2020-09-11 ENCOUNTER — Ambulatory Visit: Admission: RE | Admit: 2020-09-11 | Payer: 59 | Source: Ambulatory Visit

## 2020-09-17 ENCOUNTER — Ambulatory Visit
Admission: RE | Admit: 2020-09-17 | Discharge: 2020-09-17 | Disposition: A | Discharge status: Home / Self Care | Payer: 59 | Source: Ambulatory Visit | Attending: Family Medicine | Admitting: Family Medicine

## 2020-09-17 ENCOUNTER — Other Ambulatory Visit: Payer: Self-pay

## 2020-09-17 DIAGNOSIS — Z1231 Encounter for screening mammogram for malignant neoplasm of breast: Secondary | ICD-10-CM | POA: Diagnosis not present

## 2020-09-18 ENCOUNTER — Telehealth: Payer: 59 | Admitting: Oncology

## 2020-09-24 ENCOUNTER — Encounter: Payer: Self-pay | Admitting: Oncology

## 2020-09-24 ENCOUNTER — Other Ambulatory Visit: Payer: Self-pay

## 2020-09-24 ENCOUNTER — Inpatient Hospital Stay: Payer: 59 | Admitting: Oncology

## 2020-09-24 NOTE — Progress Notes (Signed)
Patient scheduled for mychart visit.

## 2020-10-08 ENCOUNTER — Ambulatory Visit: Payer: 59 | Admitting: Oncology

## 2020-10-26 ENCOUNTER — Encounter: Payer: Self-pay | Admitting: Oncology

## 2020-10-26 ENCOUNTER — Inpatient Hospital Stay: Payer: Medicare Other | Attending: Oncology | Admitting: Oncology

## 2020-10-26 DIAGNOSIS — D72819 Decreased white blood cell count, unspecified: Secondary | ICD-10-CM | POA: Diagnosis not present

## 2020-10-26 DIAGNOSIS — R591 Generalized enlarged lymph nodes: Secondary | ICD-10-CM | POA: Diagnosis not present

## 2020-10-26 DIAGNOSIS — E538 Deficiency of other specified B group vitamins: Secondary | ICD-10-CM | POA: Insufficient documentation

## 2020-10-26 NOTE — Progress Notes (Signed)
Trouble with her digestion. Celiac ? Diarrhea if she eats anything with gluten. No swollen lymph nodes. Pt has fair energy. Denies night sweats or fevers. Has mild edema in her ankles. Arthritis in right knee.

## 2020-10-26 NOTE — Progress Notes (Signed)
HEMATOLOGY-ONCOLOGY TeleHEALTH VISIT PROGRESS NOTE  I connected with Donna Austin on 10/26/20  at 11:30 AM EDT by video enabled telemedicine visit and verified that I am speaking with the correct person using two identifiers. I discussed the limitations, risks, security and privacy concerns of performing an evaluation and management service by telemedicine and the availability of in-person appointments. The patient expressed understanding and agreed to proceed.   Other persons participating in the visit and their role in the encounter:  None  Patient's location: Home  Provider's location: office Chief Complaint: Lymphadenopathy   INTERVAL HISTORY Donna Austin is a 70 y.o. female who has above history reviewed by me today presents for follow up visit for management of lymphadenopathy Patient had blood work done and presents to discuss about results and management plan.   She reports to her chronic digestive issues.  Diarrhea if she with gluten.  Denies any constitutional symptoms.    Review of Systems  Constitutional:  Negative for appetite change, chills, fatigue and fever.  HENT:   Negative for hearing loss and voice change.   Eyes:  Negative for eye problems.  Respiratory:  Negative for chest tightness and cough.   Cardiovascular:  Negative for chest pain.  Gastrointestinal:  Positive for diarrhea. Negative for abdominal distention, abdominal pain and blood in stool.  Endocrine: Negative for hot flashes.  Genitourinary:  Negative for difficulty urinating and frequency.   Musculoskeletal:  Negative for arthralgias.  Skin:  Negative for itching and rash.  Neurological:  Negative for extremity weakness.  Hematological:  Negative for adenopathy.  Psychiatric/Behavioral:  Negative for confusion.    Past Medical History:  Diagnosis Date   Anemia    Arthritis    Celiac disease    Connective tissue disease overlap syndrome (HCC)    Elevated CPK    Hyperlipidemia    Hypertension     Lupus ([place])    Obesity    Raynaud's disease    Raynaud's disease    Sleep apnea    Past Surgical History:  Procedure Laterality Date   ABDOMINAL HYSTERECTOMY     COLONOSCOPY WITH PROPOFOL N/A 11/09/2015   Procedure: COLONOSCOPY WITH PROPOFOL;  Surgeon: Manya Silvas, MD;  Location: [facility] ENDOSCOPY;  Service: Endoscopy;  Laterality: N/A;   RIGHT/LEFT HEART CATH AND CORONARY ANGIOGRAPHY N/A 06/10/2020   Procedure: RIGHT/LEFT HEART CATH AND CORONARY ANGIOGRAPHY;  Surgeon: Yolonda Kida, MD;  Location: [place] [facility];  Service: Cardiovascular;  Laterality: N/A;    Family History  Problem Relation Age of Onset   Breast cancer Cousin    Breast cancer Maternal Grandmother    Breast cancer Sister 31    Social History   Socioeconomic History   Marital status: Single    Spouse name: Not on file   Number of children: 1   Years of education: Not on file   Highest education level: Not on file  Occupational History   Not on file  Tobacco Use   Smoking status: Former   Smokeless tobacco: Never  Vaping Use   Vaping Use: Never used  Substance and Sexual Activity   Alcohol use: Yes    Comment: occasional   Drug use: No   Sexual activity: Not on file  Other Topics Concern   Not on file  Social History Narrative   Lives by herself. Son lives in [place] & sees pt. Often. 3 grandchildren    Social Determinants of Health   Financial Resource Strain: Not on  file  Food Insecurity: Not on file  Transportation Needs: Not on file  Physical Activity: Not on file  Stress: Not on file  Social Connections: Not on file  Intimate Partner Violence: Not on file    Current Outpatient Medications on File Prior to Visit  Medication Sig Dispense Refill   amLODipine (NORVASC) 10 MG tablet Take 10 mg by mouth in the morning.     aspirin EC 81 MG tablet Take 81 mg by mouth in the morning. Swallow whole.     atorvastatin (LIPITOR) 20 MG tablet Take 20 mg by mouth every Monday,  Wednesday, and Friday at 8 PM.     augmented betamethasone dipropionate (DIPROLENE-AF) 0.05 % cream Apply 1 application topically 2 (two) times daily as needed (lupus sarcoidosis).     azaTHIOprine (IMURAN) 50 MG tablet Take 150 mg by mouth in the morning.     beta carotene 25000 UNIT capsule Take 25,000 Units by mouth in the morning.     Cholecalciferol (VITAMIN D3) 50 MCG (2000 UT) TABS Take 2,000 Units by mouth in the morning.     fexofenadine (ALLEGRA) 180 MG tablet Take 180 mg by mouth daily as needed for allergies or rhinitis.     fluticasone (FLONASE) 50 MCG/ACT nasal spray Place 1 spray into both nostrils daily as needed for allergies or rhinitis.     furosemide (LASIX) 20 MG tablet Take 20 mg by mouth daily as needed (fluid retention).     hydrALAZINE (APRESOLINE) 50 MG tablet Take 100 mg by mouth in the morning.     hydrochlorothiazide (HYDRODIURIL) 25 MG tablet Take 25 mg by mouth in the morning.     hydroxychloroquine (PLAQUENIL) 200 MG tablet Take 200 mg by mouth in the morning.     irbesartan (AVAPRO) 300 MG tablet Take 300 mg by mouth in the morning.     Menthol (ICY HOT BACK) 5 % PTCH Place 1 patch onto the skin daily as needed (pain.).     pantoprazole (PROTONIX) 20 MG tablet Take 20 mg by mouth daily before breakfast.     predniSONE (DELTASONE) 5 MG tablet Take 5 mg by mouth in the morning.     tacrolimus (PROTOPIC) 0.03 % ointment Apply 1 application topically 2 (two) times daily as needed (lupus sarcoidosis).     vitamin C (ASCORBIC ACID) 500 MG tablet Take 500 mg by mouth in the morning.     vitamin E 180 MG (400 UNITS) capsule Take 400 Units by mouth in the morning.     acetaminophen (TYLENOL) 650 MG CR tablet Take 1,950 mg by mouth at bedtime as needed for pain.     No current facility-administered medications on file prior to visit.    Allergies  Allergen Reactions   Methotrexate Derivatives Other (See Comments)    Neutropenia    Other     Staples (post  operatively)--infection   Latex Rash       Observations/Objective: There were no vitals filed for this visit. There is no height or weight on file to calculate BMI.  Physical Exam Neurological:     Mental Status: She is alert.    CBC    Component Value Date/Time   WBC 3.8 (L) 08/25/2020 1545   RBC 3.11 (L) 08/25/2020 1545   RBC 3.11 (L) 08/25/2020 1545   HGB 9.8 (L) 08/25/2020 1545   HGB 11.4 (L) 05/07/2013 1355   HCT 31.4 (L) 08/25/2020 1545   HCT 35.3 05/07/2013 1355   PLT  269 08/25/2020 1545   PLT 191 05/07/2013 1355   MCV 101.0 (H) 08/25/2020 1545   MCV 96 05/07/2013 1355   MCH 31.5 08/25/2020 1545   MCHC 31.2 08/25/2020 1545   RDW 14.6 08/25/2020 1545   RDW 13.9 05/07/2013 1355   LYMPHSABS 0.4 (L) 08/25/2020 1545   LYMPHSABS 1.2 05/07/2013 1355   MONOABS 0.7 08/25/2020 1545   MONOABS 0.9 05/07/2013 1355   EOSABS 0.0 08/25/2020 1545   EOSABS 0.0 05/07/2013 1355   BASOSABS 0.0 08/25/2020 1545   BASOSABS 0.0 05/07/2013 1355    CMP  No results found for: NA, K, CL, CO2, GLUCOSE, BUN, CREATININE, CALCIUM, PROT, ALBUMIN, AST, ALT, ALKPHOS, BILITOT, GFRNONAA, GFRAA   Assessment and Plan: 1. Lymphadenopathy   2. Leukopenia, unspecified type     #Chronic leukopenia, this could be secondary to vitamin B12 deficiency. B12 level is borderline low, 218. Other work-up including multiple myeloma panel, flow cytometry negative.  Normal folate level. Recommend patient to start B12 intramuscular injection 1000 MCG daily for 5 days followed by weekly x4.  We will repeat labs including B12 and CBC.  Anticipate improvement of leukopenia if this is due to her borderline B12 deficiency. Pancytopenia could be also secondary to autoimmune disease, medication side effects-Imuran  #Lymphadenopathy, Patient had CT chest scan done at [facility] in February 2022.- Numerous lymph nodes are slightly more prominent in number and size than typically seen but nonspecific.  Recommend patient to  repeat CT scan to confirm stability.  Follow Up Instructions: 6 weeks lab MD plus B12 injection   I discussed the assessment and treatment plan with the patient. The patient was provided an opportunity to ask questions and all were answered. The patient agreed with the plan and demonstrated an understanding of the instructions.  The patient was advised to call back or seek an in-person evaluation if the symptoms worsen or if the condition fails to improve as anticipated.   Earlie Server, MD 10/26/2020 8:00 PM

## 2020-10-27 ENCOUNTER — Telehealth: Payer: Self-pay

## 2020-10-27 DIAGNOSIS — R591 Generalized enlarged lymph nodes: Secondary | ICD-10-CM

## 2020-10-27 NOTE — Telephone Encounter (Signed)
-----   Message from Rickard Patience, MD sent at 10/26/2020  8:07 PM EDT ----- Pleas obtain CT chest wo contrast. Reason is lymphadenopathy, baseline CT was uploaded to EMR.  Patient knows

## 2020-10-27 NOTE — Telephone Encounter (Signed)
Please schedule CT chest and notify pt of appt.

## 2020-11-02 ENCOUNTER — Other Ambulatory Visit: Payer: Self-pay

## 2020-11-02 ENCOUNTER — Inpatient Hospital Stay: Payer: Medicare Other

## 2020-11-02 DIAGNOSIS — R591 Generalized enlarged lymph nodes: Secondary | ICD-10-CM

## 2020-11-02 DIAGNOSIS — E538 Deficiency of other specified B group vitamins: Secondary | ICD-10-CM | POA: Diagnosis present

## 2020-11-02 MED ORDER — CYANOCOBALAMIN 1000 MCG/ML IJ SOLN
1000.0000 ug | Freq: Once | INTRAMUSCULAR | Status: AC
  Administered 2020-11-02: 1000 ug via INTRAMUSCULAR
  Filled 2020-11-02: qty 1

## 2020-11-03 ENCOUNTER — Other Ambulatory Visit: Payer: Self-pay | Admitting: Oncology

## 2020-11-03 ENCOUNTER — Inpatient Hospital Stay: Payer: Medicare Other

## 2020-11-03 DIAGNOSIS — E538 Deficiency of other specified B group vitamins: Secondary | ICD-10-CM

## 2020-11-03 MED ORDER — CYANOCOBALAMIN 1000 MCG/ML IJ SOLN
1000.0000 ug | Freq: Once | INTRAMUSCULAR | Status: AC
  Administered 2020-11-03: 1000 ug via INTRAMUSCULAR
  Filled 2020-11-03: qty 1

## 2020-11-04 ENCOUNTER — Inpatient Hospital Stay: Payer: Medicare Other

## 2020-11-05 ENCOUNTER — Inpatient Hospital Stay: Payer: Medicare Other

## 2020-11-05 ENCOUNTER — Other Ambulatory Visit: Payer: Self-pay

## 2020-11-05 DIAGNOSIS — D72819 Decreased white blood cell count, unspecified: Secondary | ICD-10-CM

## 2020-11-05 DIAGNOSIS — E538 Deficiency of other specified B group vitamins: Secondary | ICD-10-CM | POA: Diagnosis not present

## 2020-11-05 MED ORDER — CYANOCOBALAMIN 1000 MCG/ML IJ SOLN
1000.0000 ug | Freq: Once | INTRAMUSCULAR | Status: AC
  Administered 2020-11-05: 1000 ug via INTRAMUSCULAR
  Filled 2020-11-05: qty 1

## 2020-11-06 ENCOUNTER — Inpatient Hospital Stay: Payer: Medicare Other

## 2020-11-06 DIAGNOSIS — E538 Deficiency of other specified B group vitamins: Secondary | ICD-10-CM | POA: Diagnosis not present

## 2020-11-06 DIAGNOSIS — D72819 Decreased white blood cell count, unspecified: Secondary | ICD-10-CM

## 2020-11-06 MED ORDER — CYANOCOBALAMIN 1000 MCG/ML IJ SOLN
1000.0000 ug | Freq: Once | INTRAMUSCULAR | Status: AC
  Administered 2020-11-06: 1000 ug via INTRAMUSCULAR
  Filled 2020-11-06: qty 1

## 2020-11-09 ENCOUNTER — Inpatient Hospital Stay: Payer: Medicare Other

## 2020-11-09 ENCOUNTER — Other Ambulatory Visit: Payer: Self-pay

## 2020-11-09 DIAGNOSIS — D72819 Decreased white blood cell count, unspecified: Secondary | ICD-10-CM

## 2020-11-09 DIAGNOSIS — E538 Deficiency of other specified B group vitamins: Secondary | ICD-10-CM | POA: Diagnosis not present

## 2020-11-09 MED ORDER — CYANOCOBALAMIN 1000 MCG/ML IJ SOLN
1000.0000 ug | Freq: Once | INTRAMUSCULAR | Status: AC
  Administered 2020-11-09: 1000 ug via INTRAMUSCULAR
  Filled 2020-11-09: qty 1

## 2020-11-10 ENCOUNTER — Ambulatory Visit
Admission: RE | Admit: 2020-11-10 | Discharge: 2020-11-10 | Disposition: A | Discharge status: Home / Self Care | Payer: Medicare Other | Source: Ambulatory Visit | Attending: Oncology | Admitting: Oncology

## 2020-11-10 DIAGNOSIS — R591 Generalized enlarged lymph nodes: Secondary | ICD-10-CM | POA: Insufficient documentation

## 2020-11-13 ENCOUNTER — Other Ambulatory Visit: Payer: Self-pay

## 2020-11-13 ENCOUNTER — Inpatient Hospital Stay: Payer: Medicare Other

## 2020-11-13 DIAGNOSIS — E538 Deficiency of other specified B group vitamins: Secondary | ICD-10-CM | POA: Diagnosis not present

## 2020-11-13 DIAGNOSIS — D72819 Decreased white blood cell count, unspecified: Secondary | ICD-10-CM

## 2020-11-13 MED ORDER — CYANOCOBALAMIN 1000 MCG/ML IJ SOLN
1000.0000 ug | Freq: Once | INTRAMUSCULAR | Status: AC
  Administered 2020-11-13: 1000 ug via INTRAMUSCULAR
  Filled 2020-11-13: qty 1

## 2020-11-20 ENCOUNTER — Other Ambulatory Visit: Payer: Self-pay

## 2020-11-20 ENCOUNTER — Inpatient Hospital Stay: Payer: Medicare Other

## 2020-11-20 DIAGNOSIS — D72819 Decreased white blood cell count, unspecified: Secondary | ICD-10-CM

## 2020-11-20 DIAGNOSIS — E538 Deficiency of other specified B group vitamins: Secondary | ICD-10-CM | POA: Diagnosis not present

## 2020-11-20 MED ORDER — CYANOCOBALAMIN 1000 MCG/ML IJ SOLN
1000.0000 ug | Freq: Once | INTRAMUSCULAR | Status: AC
  Administered 2020-11-20: 1000 ug via INTRAMUSCULAR
  Filled 2020-11-20: qty 1

## 2020-11-27 ENCOUNTER — Inpatient Hospital Stay: Payer: Medicare Other | Attending: Oncology

## 2020-11-27 ENCOUNTER — Other Ambulatory Visit: Payer: Self-pay

## 2020-11-27 DIAGNOSIS — Z803 Family history of malignant neoplasm of breast: Secondary | ICD-10-CM | POA: Insufficient documentation

## 2020-11-27 DIAGNOSIS — R591 Generalized enlarged lymph nodes: Secondary | ICD-10-CM | POA: Diagnosis not present

## 2020-11-27 DIAGNOSIS — D72819 Decreased white blood cell count, unspecified: Secondary | ICD-10-CM | POA: Diagnosis not present

## 2020-11-27 DIAGNOSIS — E538 Deficiency of other specified B group vitamins: Secondary | ICD-10-CM | POA: Insufficient documentation

## 2020-11-27 DIAGNOSIS — D539 Nutritional anemia, unspecified: Secondary | ICD-10-CM | POA: Insufficient documentation

## 2020-11-27 DIAGNOSIS — Z87891 Personal history of nicotine dependence: Secondary | ICD-10-CM | POA: Insufficient documentation

## 2020-11-27 DIAGNOSIS — Z90710 Acquired absence of both cervix and uterus: Secondary | ICD-10-CM | POA: Insufficient documentation

## 2020-11-27 DIAGNOSIS — I10 Essential (primary) hypertension: Secondary | ICD-10-CM | POA: Insufficient documentation

## 2020-11-27 MED ORDER — CYANOCOBALAMIN 1000 MCG/ML IJ SOLN
1000.0000 ug | Freq: Once | INTRAMUSCULAR | Status: AC
  Administered 2020-11-27: 1000 ug via INTRAMUSCULAR

## 2020-12-03 ENCOUNTER — Encounter: Payer: Self-pay | Admitting: Oncology

## 2020-12-04 ENCOUNTER — Inpatient Hospital Stay: Payer: Medicare Other

## 2020-12-04 ENCOUNTER — Other Ambulatory Visit: Payer: Self-pay

## 2020-12-04 DIAGNOSIS — D72819 Decreased white blood cell count, unspecified: Secondary | ICD-10-CM

## 2020-12-04 DIAGNOSIS — E538 Deficiency of other specified B group vitamins: Secondary | ICD-10-CM | POA: Diagnosis not present

## 2020-12-04 MED ORDER — CYANOCOBALAMIN 1000 MCG/ML IJ SOLN
1000.0000 ug | Freq: Once | INTRAMUSCULAR | Status: AC
  Administered 2020-12-04: 1000 ug via INTRAMUSCULAR
  Filled 2020-12-04: qty 1

## 2020-12-07 ENCOUNTER — Inpatient Hospital Stay: Payer: Medicare Other

## 2020-12-10 ENCOUNTER — Inpatient Hospital Stay: Payer: Medicare Other

## 2020-12-10 ENCOUNTER — Inpatient Hospital Stay: Payer: Medicare Other | Admitting: Oncology

## 2020-12-16 ENCOUNTER — Other Ambulatory Visit: Payer: Self-pay

## 2020-12-16 ENCOUNTER — Inpatient Hospital Stay: Payer: Medicare Other

## 2020-12-16 DIAGNOSIS — R591 Generalized enlarged lymph nodes: Secondary | ICD-10-CM

## 2020-12-16 DIAGNOSIS — E538 Deficiency of other specified B group vitamins: Secondary | ICD-10-CM | POA: Diagnosis not present

## 2020-12-16 LAB — COMPREHENSIVE METABOLIC PANEL
ALT: 9 U/L (ref 0–44)
AST: 15 U/L (ref 15–41)
Albumin: 4.2 g/dL (ref 3.5–5.0)
Alkaline Phosphatase: 46 U/L (ref 38–126)
Anion gap: 8 (ref 5–15)
BUN: 14 mg/dL (ref 8–23)
CO2: 25 mmol/L (ref 22–32)
Calcium: 9 mg/dL (ref 8.9–10.3)
Chloride: 100 mmol/L (ref 98–111)
Creatinine, Ser: 0.74 mg/dL (ref 0.44–1.00)
GFR, Estimated: >60 mL/min (ref >60)
Glucose, Bld: 98 mg/dL (ref 70–99)
Potassium: 3.7 mmol/L (ref 3.5–5.1)
Sodium: 133 mmol/L — ABNORMAL LOW (ref 135–145)
Total Bilirubin: 0.2 mg/dL — ABNORMAL LOW (ref 0.3–1.2)
Total Protein: 8.4 g/dL — ABNORMAL HIGH (ref 6.5–8.1)

## 2020-12-16 LAB — CBC WITH DIFFERENTIAL/PLATELET
Abs Immature Granulocytes: 0.02 10*3/uL (ref 0.00–0.07)
Basophils Absolute: 0 10*3/uL (ref 0.0–0.1)
Basophils Relative: 0 %
Eosinophils Absolute: 0 10*3/uL (ref 0.0–0.5)
Eosinophils Relative: 1 %
HCT: 30.3 % — ABNORMAL LOW (ref 36.0–46.0)
Hemoglobin: 9.9 g/dL — ABNORMAL LOW (ref 12.0–15.0)
Immature Granulocytes: 1 %
Lymphocytes Relative: 14 %
Lymphs Abs: 0.5 10*3/uL — ABNORMAL LOW (ref 0.7–4.0)
MCH: 32.2 pg (ref 26.0–34.0)
MCHC: 32.7 g/dL (ref 30.0–36.0)
MCV: 98.7 fL (ref 80.0–100.0)
Monocytes Absolute: 0.7 10*3/uL (ref 0.1–1.0)
Monocytes Relative: 19 %
Neutro Abs: 2.5 10*3/uL (ref 1.7–7.7)
Neutrophils Relative %: 65 %
Platelets: 309 10*3/uL (ref 150–400)
RBC: 3.07 MIL/uL — ABNORMAL LOW (ref 3.87–5.11)
RDW: 15.2 % (ref 11.5–15.5)
WBC: 3.8 10*3/uL — ABNORMAL LOW (ref 4.0–10.5)
nRBC: 0 % (ref 0.0–0.2)

## 2020-12-16 LAB — VITAMIN B12: Vitamin B-12: 1569 pg/mL — ABNORMAL HIGH (ref 180–914)

## 2020-12-17 LAB — INTRINSIC FACTOR ANTIBODIES: Intrinsic Factor: 1 AU/mL (ref 0.0–1.1)

## 2020-12-18 LAB — ANTI-PARIETAL ANTIBODY: Parietal Cell Antibody-IgG: 6.2 Units (ref 0.0–20.0)

## 2020-12-21 ENCOUNTER — Encounter: Payer: Self-pay | Admitting: Oncology

## 2020-12-21 ENCOUNTER — Inpatient Hospital Stay: Payer: Medicare Other

## 2020-12-21 ENCOUNTER — Inpatient Hospital Stay (HOSPITAL_BASED_OUTPATIENT_CLINIC_OR_DEPARTMENT_OTHER): Payer: Medicare Other | Admitting: Oncology

## 2020-12-21 ENCOUNTER — Other Ambulatory Visit: Payer: Self-pay

## 2020-12-21 VITALS — BP 146/89 | HR 108 | Temp 96.7°F | Wt 258.0 lb

## 2020-12-21 DIAGNOSIS — D72819 Decreased white blood cell count, unspecified: Secondary | ICD-10-CM | POA: Diagnosis not present

## 2020-12-21 DIAGNOSIS — D539 Nutritional anemia, unspecified: Secondary | ICD-10-CM

## 2020-12-21 DIAGNOSIS — R591 Generalized enlarged lymph nodes: Secondary | ICD-10-CM

## 2020-12-21 DIAGNOSIS — E538 Deficiency of other specified B group vitamins: Secondary | ICD-10-CM

## 2020-12-21 LAB — COMP PANEL: LEUKEMIA/LYMPHOMA

## 2020-12-21 NOTE — Progress Notes (Signed)
Hematology/Oncology Consult note [facility] [phone] [phone]   Patient Care Team: Rayetta Humphrey, MD as PCP - General (Family Medicine)  REFERRING PROVIDER: Rayetta Humphrey, MD  CHIEF COMPLAINTS/REASON FOR VISIT:  Anemia and lymphadenopathy, B12 deficiency  HISTORY OF PRESENTING ILLNESS:   Donna Austin is a  70 y.o.  female with PMH listed below was seen in consultation at the request of  Rayetta Humphrey, MD  for evaluation of leukopenia and a large lymph node  Patient has history of mixed connective tissue disease, discoid lupus, Raynaud's disease, osteoarthritis and follows up with Dr. Gavin Potters.  Patient was previously on methotrexate treatment which was stopped due to neutropenia.  She is currently on Imuran, low-dose steroids and Plaquenil.  Patient had a CT chest done in February 2022 for evaluation of dyspnea on exertion. 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  No definite acute pulmonary abnormality.  Severe atherosclerosis.  Numerous lymph nodes are slightly more prominent in the number and a size 10 typically seen but nonspecific.  Patient also had a blood work done on 08/11/2020. CBC showed hemoglobin 10.3, MCV 99, platelet count 287, white count 5.4. Differential showed decreased absolute lymphocytes 0.46, increased monocyte percentage 19.4, decreased eosinophil percentage.  Normal ANC and neutrophil percentage. Patient denies any unintentional weight loss, fever, severe night sweats.  She endorses intermittent hot flash and sweating.  INTERVAL HISTORY Donna Austin is a 70 y.o. female who has above history reviewed by me today presents for follow up visit for management of anemia and lymphadenopathy, B12 deficiency Patient has received vitamin B12 injections.  She also has started on oral vitamin B12 supplementation.  She cannot recall the dosage  of the B12 supplements. She has been on Imuran for autoimmune disease.  Recently ran out of Imuran.      Review of Systems  Constitutional:  Negative for appetite change, chills, fatigue and fever.  HENT:   Negative for hearing loss and voice change.   Eyes:  Negative for eye problems.  Respiratory:  Negative for chest tightness and cough.   Cardiovascular:  Negative for chest pain.  Gastrointestinal:  Negative for abdominal distention, abdominal pain and blood in stool.  Genitourinary:  Negative for difficulty urinating and frequency.   Musculoskeletal:  Negative for arthralgias.  Skin:  Negative for itching and rash.  Neurological:  Negative for extremity weakness.  Hematological:  Negative for adenopathy.  Psychiatric/Behavioral:  Negative for confusion.    MEDICAL HISTORY:  Past Medical History:  Diagnosis Date   Anemia    Arthritis    Celiac disease    Connective tissue disease overlap syndrome (HCC)    Elevated CPK    Hyperlipidemia    Hypertension    Lupus (HCC)    Obesity    Raynaud's disease    Raynaud's disease    Sleep apnea     SURGICAL HISTORY: Past Surgical History:  Procedure Laterality Date   ABDOMINAL HYSTERECTOMY     COLONOSCOPY WITH PROPOFOL N/A 11/09/2015   Procedure: COLONOSCOPY WITH PROPOFOL;  Surgeon: Scot Jun, MD;  Location: [facility] ENDOSCOPY;  Service: Endoscopy;  Laterality: N/A;   RIGHT/LEFT HEART CATH AND CORONARY ANGIOGRAPHY N/A 06/10/2020   Procedure: RIGHT/LEFT HEART CATH AND CORONARY ANGIOGRAPHY;  Surgeon: Alwyn Pea, MD;  Location: [facility] INVASIVE CV LAB;  Service: Cardiovascular;  Laterality: N/A;    SOCIAL HISTORY: Patient lives in [place].  She works as a Systems developer for a company.  Plans to retire. Social History   Socioeconomic History   Marital status: Single    Spouse name: Not on file   Number of children: 1   Years of education: Not on file   Highest education level: Not on file  Occupational History   Not on  file  Tobacco Use   Smoking status: Former   Smokeless tobacco: Never  Vaping Use   Vaping Use: Never used  Substance and Sexual Activity   Alcohol use: Yes    Comment: occasional   Drug use: No   Sexual activity: Not on file  Other Topics Concern   Not on file  Social History Narrative   Lives by herself. Son lives in [place] & sees pt. Often. 3 grandchildren    Social Determinants of Corporate investment banker Strain: Not on file  Food Insecurity: Not on file  Transportation Needs: Not on file  Physical Activity: Not on file  Stress: Not on file  Social Connections: Not on file  Intimate Partner Violence: Not on file    FAMILY HISTORY: Family History  Problem Relation Age of Onset   Breast cancer Cousin    Breast cancer Maternal Grandmother    Breast cancer Sister 20    ALLERGIES:  is allergic to methotrexate derivatives, other, and latex.  MEDICATIONS:  Current Outpatient Medications  Medication Sig Dispense Refill   acetaminophen (TYLENOL) 650 MG CR tablet Take 1,950 mg by mouth at bedtime as needed for pain.     amLODipine (NORVASC) 10 MG tablet Take 10 mg by mouth in the morning.     aspirin EC 81 MG tablet Take 81 mg by mouth in the morning. Swallow whole.     atorvastatin (LIPITOR) 20 MG tablet Take 20 mg by mouth every Monday, Wednesday, and Friday at 8 PM.     augmented betamethasone dipropionate (DIPROLENE-AF) 0.05 % cream Apply 1 application topically 2 (two) times daily as needed (lupus sarcoidosis).     azaTHIOprine (IMURAN) 50 MG tablet Take 150 mg by mouth in the morning.     beta carotene 25366 UNIT capsule Take 25,000 Units by mouth in the morning.     Cholecalciferol (VITAMIN D3) 50 MCG (2000 UT) TABS Take 2,000 Units by mouth in the morning.     fexofenadine (ALLEGRA) 180 MG tablet Take 180 mg by mouth daily as needed for allergies or rhinitis.     fluticasone (FLONASE) 50 MCG/ACT nasal spray Place 1 spray into both nostrils daily as needed for  allergies or rhinitis.     furosemide (LASIX) 20 MG tablet Take 20 mg by mouth daily as needed (fluid retention).     hydrALAZINE (APRESOLINE) 50 MG tablet Take 100 mg by mouth in the morning.     hydrochlorothiazide (HYDRODIURIL) 25 MG tablet Take 25 mg by mouth in the morning.     hydroxychloroquine (PLAQUENIL) 200 MG tablet Take 200 mg by mouth in the morning.     irbesartan (AVAPRO) 300 MG tablet Take 300 mg by mouth in the morning.     Menthol (ICY HOT BACK) 5 % PTCH Place 1 patch onto the skin daily as needed (pain.).     pantoprazole (PROTONIX) 20 MG tablet Take 20 mg by mouth daily before breakfast.     predniSONE (DELTASONE) 5 MG tablet Take 5 mg by mouth in the morning.     tacrolimus (PROTOPIC) 0.03 % ointment Apply 1 application topically  2 (two) times daily as needed (lupus sarcoidosis).     vitamin C (ASCORBIC ACID) 500 MG tablet Take 500 mg by mouth in the morning.     vitamin E 180 MG (400 UNITS) capsule Take 400 Units by mouth in the morning.     No current facility-administered medications for this visit.     PHYSICAL EXAMINATION: ECOG PERFORMANCE STATUS: 1 - Symptomatic but completely ambulatory Vitals:   12/21/20 1305  BP: (!) 146/89  Pulse: (!) 108  Temp: (!) 96.7 F (35.9 C)   Filed Weights   12/21/20 1305  Weight: 258 lb (117 kg)    Physical Exam Constitutional:      General: She is not in acute distress.    Appearance: She is obese.  HENT:     Head: Normocephalic and atraumatic.  Eyes:     General: No scleral icterus. Cardiovascular:     Rate and Rhythm: Normal rate and regular rhythm.     Heart sounds: Normal heart sounds.  Pulmonary:     Effort: Pulmonary effort is normal. No respiratory distress.     Breath sounds: No wheezing.  Abdominal:     General: Bowel sounds are normal. There is no distension.     Palpations: Abdomen is soft.  Musculoskeletal:        General: No deformity. Normal range of motion.     Cervical back: Normal range of  motion and neck supple.  Skin:    General: Skin is warm and dry.     Findings: No erythema or rash.  Neurological:     Mental Status: She is alert and oriented to person, place, and time. Mental status is at baseline.     Cranial Nerves: No cranial nerve deficit.     Coordination: Coordination normal.  Psychiatric:        Mood and Affect: Mood normal.    LABORATORY DATA:  I have reviewed the data as listed Lab Results  Component Value Date   WBC 3.8 (L) 12/16/2020   HGB 9.9 (L) 12/16/2020   HCT 30.3 (L) 12/16/2020   MCV 98.7 12/16/2020   PLT 309 12/16/2020   Recent Labs    12/16/20 1017  NA 133*  K 3.7  CL 100  CO2 25  GLUCOSE 98  BUN 14  CREATININE 0.74  CALCIUM 9.0  GFRNONAA >60  PROT 8.4*  ALBUMIN 4.2  AST 15  ALT 9  ALKPHOS 46  BILITOT 0.2*   Iron/TIBC/Ferritin/ %Sat    Component Value Date/Time   IRON 112 10/22/2012 1604   TIBC 307 10/22/2012 1604   FERRITIN 209 10/22/2012 1604   IRONPCTSAT 36 10/22/2012 1604      RADIOGRAPHIC STUDIES: I have personally reviewed the radiological images as listed and agreed with the findings in the report. No results found.    ASSESSMENT & PLAN:  1. Leukopenia, unspecified type   2. Vitamin B12 deficiency   3. Lymphadenopathy   4. Macrocytic anemia    #Leukopenia, nonspecific lymphadenopathy on recent CT scan in February 2022. 11/10/2020, CT chest without contrast showed nonspecific prominent but nonenlarged mediastinal lymph nodes are stable compared to prior study.  Favor benign etiology. Interstitial lung disease, slightly progressive compared to prior study.  Dilatation of pulmonary trunk concerning of associated pulmonary arterial hypertension.  Aortic atherosclerosis  Lymphadenopathy is likely secondary due to autoimmune disease. Mild leukopenia and anemia could be secondary to Imuran/autoimmune disease..  I recommend patient to make follow-up appointment to see rheumatology.  #  Borderline B12 level,  patient has negative intrinsic factor antibodies and antiparietal antibody. Continue oral B12 supplementation.  We will repeat B12 at the next visit.   Orders Placed This Encounter  Procedures   CBC    Standing Status:   Future    Standing Expiration Date:   12/21/2021   Comprehensive metabolic panel    Standing Status:   Future    Standing Expiration Date:   12/21/2021   Vitamin B12    Standing Status:   Future    Standing Expiration Date:   12/21/2021    All questions were answered. The patient knows to call the clinic with any problems questions or concerns.  cc Rayetta Humphrey, MD    Return of visit:  4 months    Rickard Patience, MD, PhD 12/21/2020

## 2021-01-01 ENCOUNTER — Other Ambulatory Visit: Payer: Self-pay | Admitting: Pulmonary Disease

## 2021-01-01 DIAGNOSIS — J8410 Pulmonary fibrosis, unspecified: Secondary | ICD-10-CM

## 2021-01-01 DIAGNOSIS — J849 Interstitial pulmonary disease, unspecified: Secondary | ICD-10-CM

## 2021-01-21 ENCOUNTER — Ambulatory Visit
Admission: RE | Admit: 2021-01-21 | Discharge: 2021-01-21 | Disposition: A | Discharge status: Home / Self Care | Payer: Medicare Other | Source: Ambulatory Visit | Attending: Pulmonary Disease | Admitting: Pulmonary Disease

## 2021-01-21 DIAGNOSIS — J849 Interstitial pulmonary disease, unspecified: Secondary | ICD-10-CM | POA: Diagnosis not present

## 2021-01-21 DIAGNOSIS — J8410 Pulmonary fibrosis, unspecified: Secondary | ICD-10-CM | POA: Insufficient documentation

## 2021-02-11 DIAGNOSIS — I700 Atherosclerosis of aorta: Secondary | ICD-10-CM | POA: Insufficient documentation

## 2021-03-31 ENCOUNTER — Encounter: Payer: Self-pay | Admitting: Emergency Medicine

## 2021-03-31 ENCOUNTER — Observation Stay
Admission: EM | Admit: 2021-03-31 | Discharge: 2021-04-02 | Disposition: A | Discharge status: Home / Self Care | Payer: Medicare Other | Attending: Internal Medicine | Admitting: Internal Medicine

## 2021-03-31 ENCOUNTER — Other Ambulatory Visit: Payer: Self-pay

## 2021-03-31 ENCOUNTER — Emergency Department: Payer: Medicare Other

## 2021-03-31 DIAGNOSIS — Z20822 Contact with and (suspected) exposure to covid-19: Secondary | ICD-10-CM | POA: Diagnosis not present

## 2021-03-31 DIAGNOSIS — N858 Other specified noninflammatory disorders of uterus: Secondary | ICD-10-CM | POA: Diagnosis not present

## 2021-03-31 DIAGNOSIS — R591 Generalized enlarged lymph nodes: Secondary | ICD-10-CM | POA: Diagnosis present

## 2021-03-31 DIAGNOSIS — I2510 Atherosclerotic heart disease of native coronary artery without angina pectoris: Secondary | ICD-10-CM | POA: Diagnosis not present

## 2021-03-31 DIAGNOSIS — K5731 Diverticulosis of large intestine without perforation or abscess with bleeding: Principal | ICD-10-CM | POA: Insufficient documentation

## 2021-03-31 DIAGNOSIS — Z79899 Other long term (current) drug therapy: Secondary | ICD-10-CM | POA: Diagnosis not present

## 2021-03-31 DIAGNOSIS — I890 Lymphedema, not elsewhere classified: Secondary | ICD-10-CM

## 2021-03-31 DIAGNOSIS — D649 Anemia, unspecified: Secondary | ICD-10-CM | POA: Diagnosis not present

## 2021-03-31 DIAGNOSIS — K921 Melena: Secondary | ICD-10-CM | POA: Diagnosis not present

## 2021-03-31 DIAGNOSIS — Z91040 Latex allergy status: Secondary | ICD-10-CM | POA: Insufficient documentation

## 2021-03-31 DIAGNOSIS — D539 Nutritional anemia, unspecified: Secondary | ICD-10-CM

## 2021-03-31 DIAGNOSIS — Z87891 Personal history of nicotine dependence: Secondary | ICD-10-CM | POA: Diagnosis not present

## 2021-03-31 DIAGNOSIS — I10 Essential (primary) hypertension: Secondary | ICD-10-CM | POA: Diagnosis present

## 2021-03-31 DIAGNOSIS — I700 Atherosclerosis of aorta: Secondary | ICD-10-CM | POA: Diagnosis not present

## 2021-03-31 DIAGNOSIS — M351 Other overlap syndromes: Secondary | ICD-10-CM | POA: Diagnosis present

## 2021-03-31 DIAGNOSIS — K922 Gastrointestinal hemorrhage, unspecified: Secondary | ICD-10-CM

## 2021-03-31 DIAGNOSIS — N9489 Other specified conditions associated with female genital organs and menstrual cycle: Secondary | ICD-10-CM

## 2021-03-31 DIAGNOSIS — K625 Hemorrhage of anus and rectum: Secondary | ICD-10-CM | POA: Diagnosis present

## 2021-03-31 DIAGNOSIS — G4733 Obstructive sleep apnea (adult) (pediatric): Secondary | ICD-10-CM

## 2021-03-31 DIAGNOSIS — R7303 Prediabetes: Secondary | ICD-10-CM | POA: Diagnosis present

## 2021-03-31 DIAGNOSIS — E785 Hyperlipidemia, unspecified: Secondary | ICD-10-CM | POA: Diagnosis present

## 2021-03-31 LAB — COMPREHENSIVE METABOLIC PANEL
ALT: 12 U/L (ref 0–44)
AST: 16 U/L (ref 15–41)
Albumin: 3.4 g/dL — ABNORMAL LOW (ref 3.5–5.0)
Alkaline Phosphatase: 47 U/L (ref 38–126)
Anion gap: 5 (ref 5–15)
BUN: 14 mg/dL (ref 8–23)
CO2: 27 mmol/L (ref 22–32)
Calcium: 9.3 mg/dL (ref 8.9–10.3)
Chloride: 106 mmol/L (ref 98–111)
Creatinine, Ser: 0.61 mg/dL (ref 0.44–1.00)
GFR, Estimated: >60 mL/min (ref >60)
Glucose, Bld: 90 mg/dL (ref 70–99)
Potassium: 3.7 mmol/L (ref 3.5–5.1)
Sodium: 138 mmol/L (ref 135–145)
Total Bilirubin: 0.4 mg/dL (ref 0.3–1.2)
Total Protein: 7.2 g/dL (ref 6.5–8.1)

## 2021-03-31 LAB — CBC
HCT: 26.4 % — ABNORMAL LOW (ref 36.0–46.0)
HCT: 25.8 % — ABNORMAL LOW (ref 36.0–46.0)
Hemoglobin: 8.5 g/dL — ABNORMAL LOW (ref 12.0–15.0)
Hemoglobin: 8.4 g/dL — ABNORMAL LOW (ref 12.0–15.0)
MCH: 30.6 pg (ref 26.0–34.0)
MCH: 30.4 pg (ref 26.0–34.0)
MCHC: 32.2 g/dL (ref 30.0–36.0)
MCHC: 32.6 g/dL (ref 30.0–36.0)
MCV: 95 fL (ref 80.0–100.0)
MCV: 93.5 fL (ref 80.0–100.0)
Platelets: 236 10*3/uL (ref 150–400)
Platelets: 227 10*3/uL (ref 150–400)
RBC: 2.78 MIL/uL — ABNORMAL LOW (ref 3.87–5.11)
RBC: 2.76 MIL/uL — ABNORMAL LOW (ref 3.87–5.11)
RDW: 15.6 % — ABNORMAL HIGH (ref 11.5–15.5)
RDW: 15.7 % — ABNORMAL HIGH (ref 11.5–15.5)
WBC: 4.9 10*3/uL (ref 4.0–10.5)
WBC: 4.4 10*3/uL (ref 4.0–10.5)
nRBC: 0 % (ref 0.0–0.2)
nRBC: 0 % (ref 0.0–0.2)

## 2021-03-31 LAB — TYPE AND SCREEN
ABO/RH(D): O POS
Antibody Screen: NEGATIVE

## 2021-03-31 LAB — RETICULOCYTES
Immature Retic Fract: 16 % — ABNORMAL HIGH (ref 2.3–15.9)
RBC.: 2.75 MIL/uL — ABNORMAL LOW (ref 3.87–5.11)
Retic Count, Absolute: 38 10*3/uL (ref 19.0–186.0)
Retic Ct Pct: 1.4 % (ref 0.4–3.1)

## 2021-03-31 LAB — HIV ANTIBODY (ROUTINE TESTING W REFLEX): HIV Screen 4th Generation wRfx: NONREACTIVE

## 2021-03-31 LAB — RESP PANEL BY RT-PCR (FLU A&B, COVID) ARPGX2
Influenza A by PCR: NEGATIVE
Influenza B by PCR: NEGATIVE
SARS Coronavirus 2 by RT PCR: NEGATIVE

## 2021-03-31 LAB — IRON AND TIBC
Iron: 65 ug/dL (ref 28–170)
Saturation Ratios: 23 % (ref 10.4–31.8)
TIBC: 284 ug/dL (ref 250–450)
UIBC: 219 ug/dL

## 2021-03-31 LAB — FERRITIN: Ferritin: 37 ng/mL (ref 11–307)

## 2021-03-31 MED ORDER — IOHEXOL 300 MG/ML  SOLN
100.0000 mL | Freq: Once | INTRAMUSCULAR | Status: AC | PRN
  Administered 2021-03-31: 100 mL via INTRAVENOUS

## 2021-03-31 MED ORDER — ATORVASTATIN CALCIUM 20 MG PO TABS
20.0000 mg | ORAL_TABLET | ORAL | Status: DC
  Administered 2021-03-31: 20 mg via ORAL
  Filled 2021-03-31: qty 1

## 2021-03-31 MED ORDER — HYDROCHLOROTHIAZIDE 25 MG PO TABS
25.0000 mg | ORAL_TABLET | Freq: Every morning | ORAL | Status: DC
  Administered 2021-04-01 – 2021-04-02 (×2): 25 mg via ORAL
  Filled 2021-03-31 (×2): qty 1

## 2021-03-31 MED ORDER — IRBESARTAN 150 MG PO TABS
300.0000 mg | ORAL_TABLET | Freq: Every morning | ORAL | Status: DC
  Administered 2021-04-01 – 2021-04-02 (×2): 300 mg via ORAL
  Filled 2021-03-31 (×2): qty 2

## 2021-03-31 MED ORDER — AMLODIPINE BESYLATE 10 MG PO TABS
10.0000 mg | ORAL_TABLET | Freq: Every morning | ORAL | Status: DC
  Administered 2021-04-01 – 2021-04-02 (×2): 10 mg via ORAL
  Filled 2021-03-31 (×2): qty 1

## 2021-03-31 MED ORDER — HYDROXYCHLOROQUINE SULFATE 200 MG PO TABS
200.0000 mg | ORAL_TABLET | Freq: Every morning | ORAL | Status: DC
  Administered 2021-04-01 – 2021-04-02 (×2): 200 mg via ORAL
  Filled 2021-03-31 (×2): qty 1

## 2021-03-31 MED ORDER — LABETALOL HCL 5 MG/ML IV SOLN
10.0000 mg | INTRAVENOUS | Status: DC | PRN
  Administered 2021-03-31: 10 mg via INTRAVENOUS
  Filled 2021-03-31: qty 4

## 2021-03-31 MED ORDER — PANTOPRAZOLE SODIUM 40 MG IV SOLR
40.0000 mg | Freq: Two times a day (BID) | INTRAVENOUS | Status: DC
  Administered 2021-03-31 – 2021-04-01 (×3): 40 mg via INTRAVENOUS
  Filled 2021-03-31 (×3): qty 10

## 2021-03-31 MED ORDER — HYDRALAZINE HCL 50 MG PO TABS
100.0000 mg | ORAL_TABLET | Freq: Every morning | ORAL | Status: DC
  Administered 2021-04-01 – 2021-04-02 (×2): 100 mg via ORAL
  Filled 2021-03-31 (×2): qty 2

## 2021-03-31 NOTE — Assessment & Plan Note (Signed)
Recent B12 ok, HemOnc following outpatient  ?Will add ferritin, TIBC, retic ct ?

## 2021-03-31 NOTE — ED Provider Notes (Addendum)
? ?[facility] ?Provider Note ? ? ? Event Date/Time  ? First MD Initiated Contact with Patient 03/31/21 1412   ?  (approximate) ? ?History  ? ?Chief Complaint: Rectal Bleeding ? ?HPI ? ?Donna Austin is a 71 y.o. female the past medical history of anemia, hypertension, hyperlipidemia, presents to the emergency department for rectal bleeding.  According to the patient last night she felt like she had to have diarrhea and had 2 or 3 right red bloody bowel movements.  Today she has had 2 or 3 additional bright red bloody bowel movements.  She called her doctor who referred her to the emergency department for evaluation.  Patient denies any abdominal pain.  Patient states a history of diverticulosis, had a recent colonoscopy done several months ago that was normal per patient.  Denies any anticoagulation.  Denies any fever nausea or vomiting. ? ?Physical Exam  ? ?Triage Vital Signs: ?ED Triage Vitals  ?Enc Vitals Group  ?   BP 03/31/21 1335 (!) 129/98  ?   Pulse Rate 03/31/21 1335 93  ?   Resp 03/31/21 1335 17  ?   Temp 03/31/21 1335 99.1 ?F (37.3 ?C)  ?   Temp Source 03/31/21 1335 Oral  ?   SpO2 03/31/21 1335 100 %  ?   Weight 03/31/21 1332 257 lb 15 oz (117 kg)  ?   Height 03/31/21 1332 5\' 10"  (1.778 m)  ?   Head Circumference --   ?   Peak Flow --   ?   Pain Score 03/31/21 1332 0  ?   Pain Loc --   ?   Pain Edu? --   ?   Excl. in GC? --   ? ? ?Most recent vital signs: ?Vitals:  ? 03/31/21 1335 03/31/21 1430  ?BP: (!) 129/98 (!) 152/71  ?Pulse: 93 89  ?Resp: 17 20  ?Temp: 99.1 ?F (37.3 ?C)   ?SpO2: 100% 98%  ? ? ?General: Awake, no distress.  ?CV:  Good peripheral perfusion.  Regular rate and rhythm  ?Resp:  Normal effort.  Equal breath sounds bilaterally.  ?Abd:  No distention.  Soft, nontender.  No rebound or guarding. ?Other:  Rectal examination shows hematochezia strongly guaiac positive. ? ? ?ED Results / Procedures / Treatments  ? ?RADIOLOGY ? ?CT scan pending ? ? ?MEDICATIONS ORDERED IN  ED: ?Medications - No data to display ? ? ?IMPRESSION / MDM / ASSESSMENT AND PLAN / ED COURSE  ?I reviewed the triage vital signs and the nursing notes. ? ?Patient presents to the emergency department for rectal bleeding starting last night continuing today.  Rectal exam shows hematochezia.  Patient has a benign abdomen however given her history of diverticulosis previously we will obtain CT imaging the abdomen to rule out diverticulitis which could explain her bleeding.  Differential would also include diverticular bleed, less likely oncologic process given her recent negative colonoscopy.  No hemorrhoids on examination.  Patient's lab work shows her hemoglobin has dropped nearly 1.5 points over the past 3 months.  Chemistry is reassuring and normal.  Given the patient's declining hemoglobin with hematochezia on exam we will admit the patient to the hospital service for lower GI bleed.  Patient is agreeable to this plan. ? ?I reviewed the patient's gastroenterology note from 02/23/2021 in which the patient had a colonoscopy showing diverticulosis of the sigmoid and descending colon as well as the ascending colon but no other findings. ? ?FINAL CLINICAL IMPRESSION(S) / ED DIAGNOSES  ? ?  Lower GI bleed ? ? ?Note:  This document was prepared using Dragon voice recognition software and may include unintentional dictation errors. ?  ?Minna Antis, MD ?03/31/21 1452 ? ?  ?Minna Antis, MD ?03/31/21 1453 ? ?

## 2021-03-31 NOTE — Assessment & Plan Note (Signed)
Noted on CT abdomen today, reviewed shart, she is following w/ HemOnc for likely benign lymphadenopathy  ?

## 2021-03-31 NOTE — Assessment & Plan Note (Signed)
Recommended f/u US in 6-12 mos  ?

## 2021-03-31 NOTE — Assessment & Plan Note (Addendum)
Hgb dropped from previous, chronic anemia ?Will observe for continued bleed ?GI consulted via secure chat w/ Dr Norma Fredrickson -  ?GI recommend CTA if re-bleed.  ?--> If CTA negative and continued bleeding, would scope  ?--> If CTA positive would consult vascular surgery  ?No indication at this time for transfusion but patient states she would consent to blood products if needed  ?CT abdomen no concerning mass or evidence of bleeding ?Normal colonoscopy few mos ago except diverticula ?Will start PPI IV now given CT findings mild dilated distal esophagus w/ air/fluid, pt denies abdominal pain or vomiting ?Clear liquid diet and npo after midnight, GI to see in AM 04/01/2021  ?

## 2021-03-31 NOTE — ED Notes (Signed)
See triage note for details, pt to ED for rectal bleeding that started yesterday, pt describing as bright red blood. Denies abdominal pain, and CP. Has had diarrhea due to celiac disease. Denies N/V.   ?Has hx of diverticulitis, and celiac disease.  ? ?Pt is A&Ox4.  ?

## 2021-03-31 NOTE — Plan of Care (Signed)

## 2021-03-31 NOTE — ED Triage Notes (Signed)
Pt comes into the ED via POV c/o rectal bleeding that started yesterday.  Pt denies any blood thinner use.  Pt states it is bright red in color. Pt does have a h/o diverticulitis.  Pt has mild abd cramping, but denies that it is significant pain.  Pt in NAD at this time with even and unlabored respirations.  ?

## 2021-03-31 NOTE — H&P (Addendum)
HISTORY AND PHYSICAL   Patient: Donna Austin 71 y.o. female MRN: [id number]  Today is hospital day 0 after admission on 03/31/2021  2:05 PM  RECORD REVIEW AND HOSPITAL COURSE: Donna Austin is a 71 y.o. female the past medical history of anemia (follows/ w/ HemOnc) hypertension, hyperlipidemia, mixed connective tissue disease, discoid lupus, Raynaud's disease, osteoarthritis, interstitial lung disease, who presents to the emergency department 03/31/21 for rectal bleeding.  Night PTA she felt like she had to have diarrhea and had 2 or 3 bright red bloody bowel movements.  Date of ED visit she had 2 or 3 additional bright red bloody bowel movements.  She called her doctor who referred her to the emergency department for evaluation.  Patient denied any abdominal pain.  Patient stated a history of diverticulosis, had a recent colonoscopy done several months ago that was normal per patient.  Denies any anticoagulation.  Denies any fever nausea or vomiting.  In ED, Hgb 8.5 down from 9.9 3 mos ago. (+)hematochezia on rectal exam, no obvious hemorrhoids. Rest of CBC and CMP no concerns. CT abdomen ordered, pending as of time of admission.     Procedures and Significant Results:  CT abdomen 03/31/21:  1. Sigmoid diverticulosis but no active diverticulitis identified. 2. Dilated appearance of the ascending and transverse colon although a similar appearance is shown on prior exams including the scout image from CT chest dated 11/10/2020. This may represent a functional colonic dilatation, this tapers in the vicinity of the splenic flexure but without an obvious mass or mechanical cause for the tapering. There is also some mild caliber prominence of the terminal ileum. 3. Mildly dilated distal esophagus with air-fluid level, cannot exclude reflux or dysmotility. 4. Mildly enlarged right external iliac lymph nodes, reactive versus occult neoplastic process. 5. 3.6 by 2.2 cm simple appearing right  ovarian/adnexal lesion. Recommend follow-up US in 6-12 months. Note: This recommendation does not apply to premenarchal patients and to those with increased  risk (genetic, family history, elevated tumor markers or other high-risk factors) of ovarian cancer. Reference: JACR 2020 Feb; 17(2):248-254 6. Other imaging findings of potential clinical significance: Cylindrical bronchiectasis in both lower lobes. Mild scarring in both lung bases. Aortic and mitral valve calcifications. Aortic Atherosclerosis (ICD10-I70.0). Coronary atherosclerosis. Mild cardiomegaly. Lower lumbar impingement.  Consultants:  GI: recommended CTA if re-bleed     SUBJECTIVE:  Patient seen and examined in emergency department, resting comfortably.  Confirms above history with onset less than 24 hours multiple episodes of bright red blood per rectum, recent colonoscopy in January of this year (02/23/21) which was normal other than diverticulosis.  Denies lightheadedness, fall, abdominal pain.      ASSESSMENT & PLAN  Hematochezia likely d/t diverticular bleed, associated w/ decline in Hgb Hgb dropped from previous, chronic anemia Will observe for continued bleed GI consulted via secure chat w/ Dr Norma Fredrickson -  GI recommend CTA if re-bleed.  --> If CTA negative and continued bleeding, would scope  --> If CTA positive would consult vascular surgery  No indication at this time for transfusion but patient states she would consent to blood products if needed  CT abdomen no concerning mass or evidence of bleeding Normal colonoscopy few mos ago except diverticula Will start PPI IV now given CT findings mild dilated distal esophagus w/ air/fluid, pt denies abdominal pain or vomiting Clear liquid diet and npo after midnight, GI to see in AM 04/01/2021   Acute on Chronic Anemia - hx anemia w/  borderline B12 following w/ HemOnc, hx Celiac Dz and mixed CTD Recent B12 ok, HemOnc following outpatient  Will add ferritin, TIBC, retic  ct  Lymphadenopathy Noted on CT abdomen today, reviewed shart, she is following w/ HemOnc for likely benign lymphadenopathy   Adnexal mass on CT ABdomen 03/31/21  Recommended f/u US in 6-12 mos     VTE Ppx: SCD CODE STATUS   Code Status: Prior Admitted from: home Expected Dispo: home Barriers to discharge: continued medical w/u and observation for continued bleeding  Family communication: pt declined call to family              Past Medical History:  Diagnosis Date   Anemia    Arthritis    Celiac disease    Connective tissue disease overlap syndrome (HCC)    Elevated CPK    Hyperlipidemia    Hypertension    Lupus (HCC)    Obesity    Raynaud's disease    Raynaud's disease    Sleep apnea     Past Surgical History:  Procedure Laterality Date   ABDOMINAL HYSTERECTOMY     COLONOSCOPY WITH PROPOFOL N/A 11/09/2015   Procedure: COLONOSCOPY WITH PROPOFOL;  Surgeon: Scot Jun, MD;  Location: [facility] ENDOSCOPY;  Service: Endoscopy;  Laterality: N/A;   RIGHT/LEFT HEART CATH AND CORONARY ANGIOGRAPHY N/A 06/10/2020   Procedure: RIGHT/LEFT HEART CATH AND CORONARY ANGIOGRAPHY;  Surgeon: Alwyn Pea, MD;  Location: [facility] INVASIVE CV LAB;  Service: Cardiovascular;  Laterality: N/A;    Family History  Problem Relation Age of Onset   Breast cancer Cousin    Breast cancer Maternal Grandmother    Breast cancer Sister 71   Social History:  reports that she has quit smoking. She has never used smokeless tobacco. She reports current alcohol use. She reports that she does not use drugs.  Allergies:  Allergies  Allergen Reactions   Gluten Meal Diarrhea   Methotrexate Derivatives Other (See Comments)    Neutropenia    Other     Staples (post operatively)--infection   Latex Rash    (Not in a hospital admission)   Results for orders placed or performed during the hospital encounter of 03/31/21 (from the past 48 hour(s))  Comprehensive metabolic panel      Status: Abnormal   Collection Time: 03/31/21  1:34 PM  Result Value Ref Range   Sodium 138 135 - 145 mmol/L   Potassium 3.7 3.5 - 5.1 mmol/L   Chloride 106 98 - 111 mmol/L   CO2 27 22 - 32 mmol/L   Glucose, Bld 90 70 - 99 mg/dL    Comment: Glucose reference range applies only to samples taken after fasting for at least 8 hours.   BUN 14 8 - 23 mg/dL   Creatinine, Ser 8.17 0.44 - 1.00 mg/dL   Calcium 9.3 8.9 - 71.1 mg/dL   Total Protein 7.2 6.5 - 8.1 g/dL   Albumin 3.4 (L) 3.5 - 5.0 g/dL   AST 16 15 - 41 U/L   ALT 12 0 - 44 U/L   Alkaline Phosphatase 47 38 - 126 U/L   Total Bilirubin 0.4 0.3 - 1.2 mg/dL   GFR, Estimated >65 >79 mL/min    Comment: (NOTE) Calculated using the CKD-EPI Creatinine Equation (2021)    Anion gap 5 5 - 15    Comment: Performed at [facility], [address], [place], [place] [address]  CBC     Status: Abnormal   Collection Time: 03/31/21  1:34 PM  Result Value Ref Range   WBC 4.9 4.0 - 10.5 K/uL   RBC 2.78 (L) 3.87 - 5.11 MIL/uL   Hemoglobin 8.5 (L) 12.0 - 15.0 g/dL   HCT 26.4 (L) 36.0 - 46.0 %   MCV 95.0 80.0 - 100.0 fL   MCH 30.6 26.0 - 34.0 pg   MCHC 32.2 30.0 - 36.0 g/dL   RDW 15.6 (H) 11.5 - 15.5 %   Platelets 236 150 - 400 K/uL   nRBC 0.0 0.0 - 0.2 %    Comment: Performed at [facility], [address] [place], [place] [address]  Type and screen [place]     Status: None   Collection Time: 03/31/21  1:34 PM  Result Value Ref Range   ABO/RH(D) O POS    Antibody Screen NEG    Sample Expiration      04/03/2021,2359 Performed at [facility], [address], [place], [place] [address]   Resp Panel by RT-PCR (Flu A&B, Covid) Nasopharyngeal Swab     Status: None   Collection Time: 03/31/21  2:58 PM   Specimen: Nasopharyngeal Swab; Nasopharyngeal(NP) swabs in vial transport medium  Result Value Ref Range   SARS Coronavirus 2 by RT PCR NEGATIVE NEGATIVE    Comment: (NOTE) SARS-CoV-2  target nucleic acids are NOT DETECTED.  The SARS-CoV-2 RNA is generally detectable in upper respiratory specimens during the acute phase of infection. The lowest concentration of SARS-CoV-2 viral copies this assay can detect is 138 copies/mL. A negative result does not preclude SARS-Cov-2 infection and should not be used as the sole basis for treatment or other patient management decisions. A negative result may occur with  improper specimen collection/handling, submission of specimen other than nasopharyngeal swab, presence of viral mutation(s) within the areas targeted by this assay, and inadequate number of viral copies(<138 copies/mL). A negative result must be combined with clinical observations, patient history, and epidemiological information. The expected result is Negative.  Fact Sheet for Patients:  EntrepreneurPulse.com.au  Fact Sheet for Healthcare Providers:  IncredibleEmployment.be  This test is no t yet approved or cleared by the [place] FDA and  has been authorized for detection and/or diagnosis of SARS-CoV-2 by FDA under an Emergency Use Authorization (EUA). This EUA will remain  in effect (meaning this test can be used) for the duration of the COVID-19 declaration under Section 564(b)(1) of the Act, 21 U.S.C.section 360bbb-3(b)(1), unless the authorization is terminated  or revoked sooner.       Influenza A by PCR NEGATIVE NEGATIVE   Influenza B by PCR NEGATIVE NEGATIVE    Comment: (NOTE) The Xpert Xpress SARS-CoV-2/FLU/RSV plus assay is intended as an aid in the diagnosis of influenza from Nasopharyngeal swab specimens and should not be used as a sole basis for treatment. Nasal washings and aspirates are unacceptable for Xpert Xpress SARS-CoV-2/FLU/RSV testing.  Fact Sheet for Patients: EntrepreneurPulse.com.au  Fact Sheet for Healthcare Providers: IncredibleEmployment.be  This  test is not yet approved or cleared by the [place] FDA and has been authorized for detection and/or diagnosis of SARS-CoV-2 by FDA under an Emergency Use Authorization (EUA). This EUA will remain in effect (meaning this test can be used) for the duration of the COVID-19 declaration under Section 564(b)(1) of the Act, 21 U.S.C. section 360bbb-3(b)(1), unless the authorization is terminated or revoked.  Performed at [facility], [address] [place], [place] [address]    CT ABDOMEN PELVIS W CONTRAST  Result  Date: 03/31/2021 CLINICAL DATA:  Left lower quadrant abdominal pain. EXAM: CT ABDOMEN AND PELVIS WITH CONTRAST TECHNIQUE: Multidetector CT imaging of the abdomen and pelvis was performed using the standard protocol following bolus administration of intravenous contrast. RADIATION DOSE REDUCTION: This exam was performed according to the departmental dose-optimization program which includes automated exposure control, adjustment of the mA and/or kV according to patient size and/or use of iterative reconstruction technique. CONTRAST:  142mL OMNIPAQUE IOHEXOL 300 MG/ML  SOLN COMPARISON:  Abdominal ultrasound 10/25/2012 FINDINGS: Lower chest: Cylindrical bronchiectasis in both lower lobes. Atelectasis or scarring in the lingula and posterior basal segments of both lower lobes. Aortic and mitral valve calcifications. Thoracic aortic and right coronary artery atherosclerotic calcification. Mild cardiomegaly. Hepatobiliary: Contracted gallbladder. No focal hepatic parenchymal lesion identified. No biliary dilatation noted. Pancreas: Unremarkable Spleen: Unremarkable Adrenals/Urinary Tract: Unremarkable Stomach/Bowel: Mildly dilated distal esophagus with an air-fluid level. Cannot exclude esophageal reflux or dysmotility. Substantial to descending and sigmoid colon diverticulosis without findings of active diverticulitis. Gaseous prominence of the mobile cecum, ascending colon, and  transverse colon with transition to smaller caliber colon in the splenic flexure, but without an obvious mass lesion in this region. The transverse colon extends substantially anterior to the liver and stomach. Redundant transverse colon. Multiple clustered hyperdensities in the ascending colon, possibly pill fragments. Mild gaseous prominence of the distal most terminal ileum with some frothy stool-like material also in the distal ileum. Normal appendix. Looking back at the scout image for the CT chest from 11/10/2020, we similarly see an appearance of substantial gaseous prominence of the upper colon. Accordingly this is not thought to be acute. Several air-fluid levels are present in nondilated loops of left upper quadrant small bowel for example on image 87 series 6. Vascular/Lymphatic: Atherosclerosis is present, including aortoiliac atherosclerotic disease. There is some atheromatous plaque proximally in the SMA and celiac trunk without complete occlusion of either vessel. Upper normal size 0.9 cm left common iliac lymph node on image 54 series 2. Right external iliac node is enlarged at 1.3 cm short axis on image 78 of series 2. Other right common iliac lymph nodes are likewise enlarged. A left common iliac node measures 0.8 cm in short axis on image 72 series 2, upper normal size. Reproductive: 3.6 by 2.2 cm simple appearing fluid density lesion of the right ovary/adnexa, image 76 series 2. Uterus absent. Other: No supplemental non-categorized findings. Musculoskeletal: Lower lumbar spondylosis and degenerative disc disease contributing to bilateral foraminal impingement at L3-4 and probable left foraminal impingement at L4-5. Suspected central narrowing of the thecal sac at L3-4 and L4-5. IMPRESSION: 1. Sigmoid diverticulosis but no active diverticulitis identified. 2. Dilated appearance of the ascending and transverse colon although a similar appearance is shown on prior exams including the scout image  from CT chest dated 11/10/2020. This may represent a functional colonic dilatation, this tapers in the vicinity of the splenic flexure but without an obvious mass or mechanical cause for the tapering. There is also some mild caliber prominence of the terminal ileum. 3. Mildly dilated distal esophagus with air-fluid level, cannot exclude reflux or dysmotility. 4. Mildly enlarged right external iliac lymph nodes, reactive versus occult neoplastic process. 5. 3.6 by 2.2 cm simple appearing right ovarian/adnexal lesion. Recommend follow-up US in 6-12 months. Note: This recommendation does not apply to premenarchal patients and to those with increased risk (genetic, family history, elevated tumor markers or other high-risk factors) of ovarian cancer. Reference: JACR 2020 Feb; 17(2):248-254 6. Other imaging findings of  potential clinical significance: Cylindrical bronchiectasis in both lower lobes. Mild scarring in both lung bases. Aortic and mitral valve calcifications. Aortic Atherosclerosis (ICD10-I70.0). Coronary atherosclerosis. Mild cardiomegaly. Lower lumbar impingement. Electronically Signed   By: Van Clines M.D.   On: 03/31/2021 15:35    Review of Systems  Constitutional:  Negative for activity change, appetite change, chills and fever.  Respiratory:  Negative for chest tightness and shortness of breath.   Cardiovascular:  Negative for chest pain, palpitations and leg swelling.  Gastrointestinal:  Positive for anal bleeding and blood in stool. Negative for abdominal distention, abdominal pain, constipation, diarrhea, nausea, rectal pain and vomiting.  Genitourinary:  Negative for difficulty urinating.  Musculoskeletal:  Negative for arthralgias and joint swelling.  Skin:  Negative for color change, rash and wound.  Neurological:  Negative for dizziness, syncope, weakness and light-headedness.  Psychiatric/Behavioral:  Negative for dysphoric mood. The patient is not nervous/anxious.    Blood  pressure (!) 152/71, pulse 89, temperature 99.1 F (37.3 C), temperature source Oral, resp. rate 20, height 5\' 10"  (1.778 m), weight 117 kg, SpO2 98 %. Physical Exam Constitutional:      General: She is not in acute distress.    Appearance: Normal appearance. She is obese. She is not ill-appearing.  HENT:     Head: Normocephalic and atraumatic.  Eyes:     Extraocular Movements: Extraocular movements intact.  Cardiovascular:     Rate and Rhythm: Normal rate and regular rhythm.  Pulmonary:     Effort: Pulmonary effort is normal.     Breath sounds: Normal breath sounds.  Abdominal:     General: Abdomen is flat. Bowel sounds are normal. There is no distension.     Palpations: Abdomen is soft. There is no mass.     Tenderness: There is no abdominal tenderness. There is no guarding.  Musculoskeletal:        General: Swelling (pt states normal chrnoic edema in lower extremities no change) present. No tenderness. Normal range of motion.     Cervical back: Normal range of motion and neck supple.  Skin:    General: Skin is warm and dry.  Neurological:     General: No focal deficit present.     Mental Status: She is alert and oriented to person, place, and time. Mental status is at baseline.  Psychiatric:        Mood and Affect: Mood normal.        Behavior: Behavior normal.        Thought Content: Thought content normal.        Judgment: Judgment normal.       Emeterio Reeve, DO 03/31/2021, 4:38 PM

## 2021-04-01 DIAGNOSIS — K921 Melena: Secondary | ICD-10-CM | POA: Diagnosis not present

## 2021-04-01 LAB — CBC
HCT: 24.8 % — ABNORMAL LOW (ref 36.0–46.0)
Hemoglobin: 8 g/dL — ABNORMAL LOW (ref 12.0–15.0)
MCH: 30.7 pg (ref 26.0–34.0)
MCHC: 32.3 g/dL (ref 30.0–36.0)
MCV: 95 fL (ref 80.0–100.0)
Platelets: 228 10*3/uL (ref 150–400)
RBC: 2.61 MIL/uL — ABNORMAL LOW (ref 3.87–5.11)
RDW: 15.6 % — ABNORMAL HIGH (ref 11.5–15.5)
WBC: 3.8 10*3/uL — ABNORMAL LOW (ref 4.0–10.5)
nRBC: 0 % (ref 0.0–0.2)

## 2021-04-01 LAB — HEMOGLOBIN: Hemoglobin: 8.2 g/dL — ABNORMAL LOW (ref 12.0–15.0)

## 2021-04-01 MED ORDER — PREDNISONE 10 MG PO TABS
5.0000 mg | ORAL_TABLET | Freq: Every morning | ORAL | Status: DC
  Administered 2021-04-01 – 2021-04-02 (×2): 5 mg via ORAL
  Filled 2021-04-01 (×2): qty 1

## 2021-04-01 MED ORDER — BETA CAROTENE 25000 UNITS PO CAPS: 25000.0000 [IU] | ORAL_CAPSULE | Freq: Every morning | ORAL | Status: DC

## 2021-04-01 MED ORDER — PANTOPRAZOLE SODIUM 20 MG PO TBEC: 20.0000 mg | DELAYED_RELEASE_TABLET | Freq: Every day | ORAL | Status: DC

## 2021-04-01 MED ORDER — FLUTICASONE PROPIONATE 50 MCG/ACT NA SUSP
1.0000 | Freq: Every day | NASAL | Status: DC | PRN
  Filled 2021-04-01: qty 16

## 2021-04-01 MED ORDER — ASCORBIC ACID 500 MG PO TABS
500.0000 mg | ORAL_TABLET | Freq: Every morning | ORAL | Status: DC
  Administered 2021-04-01 – 2021-04-02 (×2): 500 mg via ORAL
  Filled 2021-04-01 (×2): qty 1

## 2021-04-01 MED ORDER — VITAMIN D3 25 MCG (1000 UNIT) PO TABS
1000.0000 [IU] | ORAL_TABLET | Freq: Every morning | ORAL | Status: DC
  Administered 2021-04-01 – 2021-04-02 (×2): 1000 [IU] via ORAL
  Filled 2021-04-01 (×4): qty 1

## 2021-04-01 MED ORDER — VITAMIN E 45 MG (100 UNIT) PO CAPS
400.0000 [IU] | ORAL_CAPSULE | Freq: Every morning | ORAL | Status: DC
  Administered 2021-04-01 – 2021-04-02 (×2): 400 [IU] via ORAL
  Filled 2021-04-01 (×2): qty 4

## 2021-04-01 NOTE — Progress Notes (Signed)
[facility] [place] NAME: Donna Austin    MR#:  [id number]  DATE OF BIRTH:  1950/09/26  SUBJECTIVE:  patient came in with bright red blood per rectum about will three bowel movements at home and of couple here in the emergency room. Sitting up in the chair. Tells me she had some dark small bowel movement. No bright red blood per rectum. Denies any abdominal pain nausea. Tolerating clear liquid diet.   VITALS:  Blood pressure 123/78, pulse 79, temperature 98.1 F (36.7 C), resp. rate 18, height 5\' 10"  (1.778 m), weight 117 kg, SpO2 100 %.  PHYSICAL EXAMINATION:   GENERAL:  71 y.o.-year-old patient lying in the bed with no acute distress.  LUNGS: Normal breath sounds bilaterally, no wheezing, rales, rhonchi.  CARDIOVASCULAR: S1, S2 normal. No murmurs, rubs, or gallops.  ABDOMEN: Soft, nontender, nondistended. Bowel sounds present.  EXTREMITIES: No  edema b/l.    NEUROLOGIC: nonfocal  patient is alert and awake SKIN: No obvious rash, lesion, or ulcer.   LABORATORY PANEL:  CBC Recent Labs  Lab 04/01/21 0448  WBC 3.8*  HGB 8.0*  HCT 24.8*  PLT 228    Chemistries  Recent Labs  Lab 03/31/21 1334  NA 138  K 3.7  CL 106  CO2 27  GLUCOSE 90  BUN 14  CREATININE 0.61  CALCIUM 9.3  AST 16  ALT 12  ALKPHOS 47  BILITOT 0.4   Cardiac Enzymes No results for input(s): TROPONINI in the last 168 hours. RADIOLOGY:  CT ABDOMEN PELVIS W CONTRAST  Result Date: 03/31/2021 CLINICAL DATA:  Left lower quadrant abdominal pain. EXAM: CT ABDOMEN AND PELVIS WITH CONTRAST TECHNIQUE: Multidetector CT imaging of the abdomen and pelvis was performed using the standard protocol following bolus administration of intravenous contrast. RADIATION DOSE REDUCTION: This exam was performed according to the departmental dose-optimization program which includes automated exposure control, adjustment of the mA and/or kV according to patient size and/or use of  iterative reconstruction technique. CONTRAST:  137mL OMNIPAQUE IOHEXOL 300 MG/ML  SOLN COMPARISON:  Abdominal ultrasound 10/25/2012 FINDINGS: Lower chest: Cylindrical bronchiectasis in both lower lobes. Atelectasis or scarring in the lingula and posterior basal segments of both lower lobes. Aortic and mitral valve calcifications. Thoracic aortic and right coronary artery atherosclerotic calcification. Mild cardiomegaly. Hepatobiliary: Contracted gallbladder. No focal hepatic parenchymal lesion identified. No biliary dilatation noted. Pancreas: Unremarkable Spleen: Unremarkable Adrenals/Urinary Tract: Unremarkable Stomach/Bowel: Mildly dilated distal esophagus with an air-fluid level. Cannot exclude esophageal reflux or dysmotility. Substantial to descending and sigmoid colon diverticulosis without findings of active diverticulitis. Gaseous prominence of the mobile cecum, ascending colon, and transverse colon with transition to smaller caliber colon in the splenic flexure, but without an obvious mass lesion in this region. The transverse colon extends substantially anterior to the liver and stomach. Redundant transverse colon. Multiple clustered hyperdensities in the ascending colon, possibly pill fragments. Mild gaseous prominence of the distal most terminal ileum with some frothy stool-like material also in the distal ileum. Normal appendix. Looking back at the scout image for the CT chest from 11/10/2020, we similarly see an appearance of substantial gaseous prominence of the upper colon. Accordingly this is not thought to be acute. Several air-fluid levels are present in nondilated loops of left upper quadrant small bowel for example on image 87 series 6. Vascular/Lymphatic: Atherosclerosis is present, including aortoiliac atherosclerotic disease. There is some atheromatous plaque proximally in the SMA and celiac trunk without complete occlusion of  either vessel. Upper normal size 0.9 cm left common iliac lymph  node on image 54 series 2. Right external iliac node is enlarged at 1.3 cm short axis on image 78 of series 2. Other right common iliac lymph nodes are likewise enlarged. A left common iliac node measures 0.8 cm in short axis on image 72 series 2, upper normal size. Reproductive: 3.6 by 2.2 cm simple appearing fluid density lesion of the right ovary/adnexa, image 76 series 2. Uterus absent. Other: No supplemental non-categorized findings. Musculoskeletal: Lower lumbar spondylosis and degenerative disc disease contributing to bilateral foraminal impingement at L3-4 and probable left foraminal impingement at L4-5. Suspected central narrowing of the thecal sac at L3-4 and L4-5. IMPRESSION: 1. Sigmoid diverticulosis but no active diverticulitis identified. 2. Dilated appearance of the ascending and transverse colon although a similar appearance is shown on prior exams including the scout image from CT chest dated 11/10/2020. This may represent a functional colonic dilatation, this tapers in the vicinity of the splenic flexure but without an obvious mass or mechanical cause for the tapering. There is also some mild caliber prominence of the terminal ileum. 3. Mildly dilated distal esophagus with air-fluid level, cannot exclude reflux or dysmotility. 4. Mildly enlarged right external iliac lymph nodes, reactive versus occult neoplastic process. 5. 3.6 by 2.2 cm simple appearing right ovarian/adnexal lesion. Recommend follow-up US in 6-12 months. Note: This recommendation does not apply to premenarchal patients and to those with increased risk (genetic, family history, elevated tumor markers or other high-risk factors) of ovarian cancer. Reference: JACR 2020 Feb; 17(2):248-254 6. Other imaging findings of potential clinical significance: Cylindrical bronchiectasis in both lower lobes. Mild scarring in both lung bases. Aortic and mitral valve calcifications. Aortic Atherosclerosis (ICD10-I70.0). Coronary atherosclerosis.  Mild cardiomegaly. Lower lumbar impingement. Electronically Signed   By: Van Clines M.D.   On: 03/31/2021 15:35    Assessment and Plan Donna Austin is a 71 y.o. female the past medical history of anemia (follows/ w/ HemOnc) hypertension, hyperlipidemia, mixed connective tissue disease, discoid lupus, Raynaud's disease, osteoarthritis, interstitial lung disease, who presents to the emergency department 03/31/21 for rectal bleeding  CT abdomen 03/31/21:  1. Sigmoid diverticulosis but no active diverticulitis identified. 2. Dilated appearance of the ascending and transverse colon although a similar appearance is shown on prior exams including the scout image from CT chest dated 11/10/2020. This may represent a functional colonic dilatation, this tapers in the vicinity of the splenic flexure but without an obvious mass or mechanical cause for the tapering. There is also some mild caliber prominence of the terminal ileum. 3. Mildly dilated distal esophagus with air-fluid level, cannot exclude reflux or dysmotility. 4. Mildly enlarged right external iliac lymph nodes, reactive versus occult neoplastic process. 5. 3.6 by 2.2 cm simple appearing right ovarian/adnexal lesion. Recommend follow-up US in 6-12 months  hematochezia likely due to diverticular bleed chronic anemia -- patient's hemoglobin stays around 9.0 -- hemoglobin down to 8.0 -- no bright red blood per rectum today. Tolerating clear liquid diet -- discussed with Dr. Serita Sheller.I. who will see patient. Recommends continue monitor. He three blades consider CT annual abdomen. == Transfuse as needed -- patient had colonoscopy few months ago was normal except diverticular disease  acute on chronic anemia with history of borderline B12 deficiency history of celiac disease/mixed connective tissue disorder -- B12 levels okay -- patient follows with hematology oncology as outpatient  Adnexal mass on CT ABdomen 03/31/21  Recommended f/u  US in 6-12 mos  HTN --resumed home meds defer to PCP.  Procedures: Family communication :none Consults :GI Dr Alice Reichert CODE STATUS: full DVT Prophylaxis :SCD Level of care: Med-Surg Status is: Observation The patient remains OBS appropriate and will d/c before 2 midnights.    TOTAL TIME TAKING CARE OF THIS PATIENT: 25 minutes.  >50% time spent on counselling and coordination of care  Note: This dictation was prepared with Dragon dictation along with smaller phrase technology. Any transcriptional errors that result from this process are unintentional.  Fritzi Mandes M.D    [facility]   CC: Primary care physician; Sharyne Peach, MD

## 2021-04-01 NOTE — Progress Notes (Signed)
PHARMACIST - PHYSICIAN ORDER COMMUNICATION ? ?CONCERNING: P&T Medication Policy on Herbal Medications ? ?DESCRIPTION:  This patient?s order for:  beta-carotene  has been noted. ? ?This product(s) is classified as an ?herbal? or natural product. ?Due to a lack of definitive safety studies or FDA approval, nonstandard manufacturing practices, plus the potential risk of unknown drug-drug interactions while on inpatient medications, the Pharmacy and Therapeutics Committee does not permit the use of ?herbal? or natural products of this type within [facility]. ?  ?ACTION TAKEN: ?The pharmacy department is unable to verify this order at this time and your patient has been informed of this safety policy. ?Please reevaluate patient?s clinical condition at discharge and address if the herbal or natural product(s) should be resumed at that time. ? ?Drusilla Kanner, PharmD, MBA ? ?

## 2021-04-01 NOTE — Consult Note (Signed)
[facility] GI Inpatient Consult Note   Jamey Reaseodoro Keith Calden Dorsey, M.D.  Reason for Consult: Lower GI bleed   Attending Requesting Consult: Sunnie NielsenNatalie Alexander, D.O.  Outpatient Primary Physician: Angus PalmsSionne George, M.D>  History of Present Illness: Theador HawthorneJanie M Austin is a 71 y.o. female who presents after progressive hematochezia that began 2 days ago while at home.  Patient said that she had eaten beets during the midday hours and "immediately had to go to the restroom".  She noticed progressive fecal urgency with passage of "large amounts of red blood".  This seemed to abate Tuesday evening but recurred on Wednesday (yesterday) and she was seen in the emergency room.  Patient denies any complaints of diarrhea, abdominal pain, fevers, chills or sweats.  She has reported no previous episodes of lower gastrointestinal bleeding.  Patient underwent a colon[facility] on January 30 [facility] revealing left and right-sided colonic diverticulosis without stigmata of bleeding.  The examination was otherwise unremarkable. Patient takes no prescription anticoagulants and continues to complain of no abdominal pain.  She is tolerating clear liquids without difficulty.  She had 2 small bowel movements today in the morning described as "small and dark red" in color.  Past Medical History:  Past Medical History:  Diagnosis Date   Anemia    Arthritis    Celiac disease    Connective tissue disease overlap syndrome (HCC)    Elevated CPK    Hyperlipidemia    Hypertension    Lupus (HCC)    Obesity    Raynaud's disease    Raynaud's disease    Sleep apnea     Problem List: Patient Active Problem List   Diagnosis Date Noted   Hyperlipidemia 03/31/2021   Hypertension 03/31/2021   Hematochezia likely d/t diverticular bleed, associated w/ decline in Hgb 03/31/2021   Acute on Chronic Anemia - hx anemia w/ borderline B12 following w/ HemOnc, hx Celiac Dz and mixed CTD 03/31/2021    Adnexal mass on CT ABdomen 03/31/21  03/31/2021   Hardening of the aorta (main artery of the heart) (HCC) 02/11/2021   Lymphadenopathy 08/25/2020   Leukopenia 08/25/2020   OSA on CPAP 01/05/2018   Lymphedema of both lower extremities 05/25/2017   Prediabetes 02/06/2015   Celiac disease 04/30/2013   Chronic anemia 04/30/2013   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    Past Surgical History: Past Surgical History:  Procedure Laterality Date   ABDOMINAL HYSTERECTOMY     COLONOSCOPY WITH PROPOFOL N/A 11/09/2015   Procedure: COLONOSCOPY WITH PROPOFOL;  Surgeon: Scot Junobert T Elliott, MD;  [facility] ENDOSCOPY;  Service: Endoscopy;  Laterality: N/A;   RIGHT/LEFT HEART CATH AND CORONARY ANGIOGRAPHY N/A 06/10/2020   Procedure: RIGHT/LEFT HEART CATH AND CORONARY ANGIOGRAPHY;  Surgeon: Alwyn Peaallwood, Dwayne D, MD;  Location: [facility] INVASIVE CV LAB;  Service: Cardiovascular;  Laterality: N/A;    Allergies: Allergies  Allergen Reactions   Gluten Meal Diarrhea   Methotrexate Derivatives Other (See Comments)    Neutropenia    Other     Staples (post operatively)--infection   Latex Rash    Home Medications: Medications Prior to Admission  Medication Sig Dispense Refill Last Dose   acetaminophen (TYLENOL) 650 MG CR tablet Take 650 mg by mouth every 8 (eight) hours as needed for pain.   Unknown at PRN   amLODipine (NORVASC) 10 MG tablet Take 10 mg by mouth in the morning.   03/31/2021 at 0800   aspirin EC 81 MG tablet  Take 81 mg by mouth in the morning.      atorvastatin (LIPITOR) 20 MG tablet Take 20 mg by mouth every Monday, Wednesday, and Friday at 8 PM.   03/29/2021 at 2000   beta carotene 25000 UNIT capsule Take 25,000 Units by mouth in the morning.      cholecalciferol (VITAMIN D) 25 MCG (1000 UNIT) tablet Take 1,000 Units by mouth in the morning.      fluticasone (FLONASE) 50 MCG/ACT nasal spray Place 1 spray into both nostrils daily as needed for allergies or rhinitis.    Unknown at PRN   furosemide (LASIX) 20 MG tablet Take 20 mg by mouth daily.   03/31/2021 at 0800   hydrALAZINE (APRESOLINE) 50 MG tablet Take 50 mg by mouth 2 (two) times daily.   03/31/2021 at 0800   hydrochlorothiazide (HYDRODIURIL) 25 MG tablet Take 25 mg by mouth in the morning.   03/31/2021 at 0800   hydroxychloroquine (PLAQUENIL) 200 MG tablet Take 200 mg by mouth in the morning.   03/31/2021 at 0800   irbesartan (AVAPRO) 300 MG tablet Take 300 mg by mouth in the morning.   03/31/2021 at 0800   pantoprazole (PROTONIX) 20 MG tablet Take 20 mg by mouth daily before breakfast.   03/31/2021 at 0800   predniSONE (DELTASONE) 5 MG tablet Take 5 mg by mouth in the morning.   03/31/2021 at 0800   vitamin C (ASCORBIC ACID) 500 MG tablet Take 500 mg by mouth in the morning.      vitamin E 180 MG (400 UNITS) capsule Take 400 Units by mouth in the morning.      Home medication reconciliation was completed with the patient.   Scheduled Inpatient Medications:    amLODipine  10 mg Oral q AM   vitamin C  500 mg Oral q AM   atorvastatin  20 mg Oral Q M,W,F-2000   cholecalciferol  1,000 Units Oral q AM   hydrALAZINE  100 mg Oral q AM   hydrochlorothiazide  25 mg Oral q AM   hydroxychloroquine  200 mg Oral q AM   irbesartan  300 mg Oral q AM   pantoprazole (PROTONIX) IV  40 mg Intravenous BID   predniSONE  5 mg Oral q AM   vitamin E  400 Units Oral q AM    Continuous Inpatient Infusions:    PRN Inpatient Medications:  fluticasone, labetalol  Family History: family history includes Breast cancer in her cousin and maternal grandmother; Breast cancer (age of onset: 75) in her sister.   GI Family History: Negative  Social History:   reports that she has quit smoking. She has never used smokeless tobacco. She reports current alcohol use. She reports that she does not use drugs. The patient denies ETOH, tobacco, or drug use.    Review of Systems: Review of Systems - History obtained from the  patient General ROS: negative Psychological ROS: negative Ophthalmic ROS: negative ENT ROS: negative Allergy and Immunology ROS: negative Hematological and Lymphatic ROS: negative Respiratory ROS: no cough, shortness of breath, or wheezing Cardiovascular ROS: no chest pain or dyspnea on exertion Genito-Urinary ROS: no dysuria, trouble voiding, or hematuria Musculoskeletal ROS: negative Neurological ROS: no TIA or stroke symptoms Dermatological ROS: negative  Physical Examination: BP 123/78    Pulse 79    Temp 98.1 F (36.7 C)    Resp 18    Ht 5\' 10"  (1.778 m)    Wt 117 kg    SpO2 100%  BMI 37.01 kg/m  Physical Exam Constitutional:      General: She is not in acute distress.    Appearance: Normal appearance. She is obese. She is not ill-appearing, toxic-appearing or diaphoretic.  HENT:     Head: Normocephalic and atraumatic.  Eyes:     General: No scleral icterus.    Conjunctiva/sclera: Conjunctivae normal.     Pupils: Pupils are equal, round, and reactive to light.  Cardiovascular:     Rate and Rhythm: Normal rate and regular rhythm.     Pulses: Normal pulses.     Heart sounds: No murmur heard.   No gallop.  Pulmonary:     Effort: Pulmonary effort is normal.     Breath sounds: Normal breath sounds.  Abdominal:     General: Bowel sounds are normal. There is no distension.     Palpations: Abdomen is soft. There is no mass.     Tenderness: There is no abdominal tenderness.     Hernia: No hernia is present.  Musculoskeletal:        General: No swelling or deformity.  Skin:    Capillary Refill: Capillary refill takes less than 2 seconds.  Neurological:     General: No focal deficit present.     Mental Status: She is alert.  Psychiatric:        Mood and Affect: Mood normal.        Thought Content: Thought content normal.        Judgment: Judgment normal.    Data: Lab Results  Component Value Date   WBC 3.8 (L) 04/01/2021   HGB 8.2 (L) 04/01/2021   HCT 24.8 (L)  04/01/2021   MCV 95.0 04/01/2021   PLT 228 04/01/2021   Recent Labs  Lab 03/31/21 1724 04/01/21 0448 04/01/21 1350  HGB 8.4* 8.0* 8.2*   Lab Results  Component Value Date   NA 138 03/31/2021   K 3.7 03/31/2021   CL 106 03/31/2021   CO2 27 03/31/2021   BUN 14 03/31/2021   CREATININE 0.61 03/31/2021   Lab Results  Component Value Date   ALT 12 03/31/2021   AST 16 03/31/2021   ALKPHOS 47 03/31/2021   BILITOT 0.4 03/31/2021   No results for input(s): APTT, INR, PTT in the last 168 hours. CBC Latest Ref Rng & Units 04/01/2021 04/01/2021 03/31/2021  WBC 4.0 - 10.5 K/uL - 3.8(L) 4.4  Hemoglobin 12.0 - 15.0 g/dL 8.2(L) 8.0(L) 8.4(L)  Hematocrit 36.0 - 46.0 % - 24.8(L) 25.8(L)  Platelets 150 - 400 K/uL - 228 227    STUDIES: CT ABDOMEN PELVIS W CONTRAST  Result Date: 03/31/2021 CLINICAL DATA:  Left lower quadrant abdominal pain. EXAM: CT ABDOMEN AND PELVIS WITH CONTRAST TECHNIQUE: Multidetector CT imaging of the abdomen and pelvis was performed using the standard protocol following bolus administration of intravenous contrast. RADIATION DOSE REDUCTION: This exam was performed according to the departmental dose-optimization program which includes automated exposure control, adjustment of the mA and/or kV according to patient size and/or use of iterative reconstruction technique. CONTRAST:  166mL OMNIPAQUE IOHEXOL 300 MG/ML  SOLN COMPARISON:  Abdominal ultrasound 10/25/2012 FINDINGS: Lower chest: Cylindrical bronchiectasis in both lower lobes. Atelectasis or scarring in the lingula and posterior basal segments of both lower lobes. Aortic and mitral valve calcifications. Thoracic aortic and right coronary artery atherosclerotic calcification. Mild cardiomegaly. Hepatobiliary: Contracted gallbladder. No focal hepatic parenchymal lesion identified. No biliary dilatation noted. Pancreas: Unremarkable Spleen: Unremarkable Adrenals/Urinary Tract: Unremarkable Stomach/Bowel: Mildly dilated distal  esophagus  with an air-fluid level. Cannot exclude esophageal reflux or dysmotility. Substantial to descending and sigmoid colon diverticulosis without findings of active diverticulitis. Gaseous prominence of the mobile cecum, ascending colon, and transverse colon with transition to smaller caliber colon in the splenic flexure, but without an obvious mass lesion in this region. The transverse colon extends substantially anterior to the liver and stomach. Redundant transverse colon. Multiple clustered hyperdensities in the ascending colon, possibly pill fragments. Mild gaseous prominence of the distal most terminal ileum with some frothy stool-like material also in the distal ileum. Normal appendix. Looking back at the scout image for the CT chest from 11/10/2020, we similarly see an appearance of substantial gaseous prominence of the upper colon. Accordingly this is not thought to be acute. Several air-fluid levels are present in nondilated loops of left upper quadrant small bowel for example on image 87 series 6. Vascular/Lymphatic: Atherosclerosis is present, including aortoiliac atherosclerotic disease. There is some atheromatous plaque proximally in the SMA and celiac trunk without complete occlusion of either vessel. Upper normal size 0.9 cm left common iliac lymph node on image 54 series 2. Right external iliac node is enlarged at 1.3 cm short axis on image 78 of series 2. Other right common iliac lymph nodes are likewise enlarged. A left common iliac node measures 0.8 cm in short axis on image 72 series 2, upper normal size. Reproductive: 3.6 by 2.2 cm simple appearing fluid density lesion of the right ovary/adnexa, image 76 series 2. Uterus absent. Other: No supplemental non-categorized findings. Musculoskeletal: Lower lumbar spondylosis and degenerative disc disease contributing to bilateral foraminal impingement at L3-4 and probable left foraminal impingement at L4-5. Suspected central narrowing of the  thecal sac at L3-4 and L4-5. IMPRESSION: 1. Sigmoid diverticulosis but no active diverticulitis identified. 2. Dilated appearance of the ascending and transverse colon although a similar appearance is shown on prior exams including the scout image from CT chest dated 11/10/2020. This may represent a functional colonic dilatation, this tapers in the vicinity of the splenic flexure but without an obvious mass or mechanical cause for the tapering. There is also some mild caliber prominence of the terminal ileum. 3. Mildly dilated distal esophagus with air-fluid level, cannot exclude reflux or dysmotility. 4. Mildly enlarged right external iliac lymph nodes, reactive versus occult neoplastic process. 5. 3.6 by 2.2 cm simple appearing right ovarian/adnexal lesion. Recommend follow-up US in 6-12 months. Note: This recommendation does not apply to premenarchal patients and to those with increased risk (genetic, family history, elevated tumor markers or other high-risk factors) of ovarian cancer. Reference: JACR 2020 Feb; 17(2):248-254 6. Other imaging findings of potential clinical significance: Cylindrical bronchiectasis in both lower lobes. Mild scarring in both lung bases. Aortic and mitral valve calcifications. Aortic Atherosclerosis (ICD10-I70.0). Coronary atherosclerosis. Mild cardiomegaly. Lower lumbar impingement. Electronically Signed   By: Van Clines M.D.   On: 03/31/2021 15:35   @IMAGES @  Assessment:  Lower GI bleeding - Resolving. Likely colonic diverticular source. Acute on chronic anemia - Hgb stable in the 8's. ? History of celiac disease. Colonic diverticulosis - per colonoscopy Jan 2023. Otherwise unremarkable colonoscopy.  COVID-19 status: Tested negative.   Recommendations:  Slowly advance diet. I will order full liquids. Monitor Hgb. Check anti-TTG ab to monitor history of celiac disease. Home tomorrow if no significant changes. Follow up with Dr. Iona Beard (PCP), then [facility] as  needed.  Thank you for the consult. Please call with questions or concerns.  [place], [place], "Lanny Hurst" MD [facility]  [address] [place], [place] [address] [phone]  04/01/2021 3:20 PM

## 2021-04-01 NOTE — TOC Progression Note (Signed)
Transition of Care (TOC) - Progression Note  ? ? ?Patient Details  ?Name: [name] ?MRN: [id number] ?Date of Birth: 11-Oct-1950 ? ?Transition of Care (TOC) CM/SW Contact  ?Marlowe Sax, RN ?Phone Number: ?04/01/2021, 9:28 AM ? ?Clinical Narrative:    ? ?Transition of Care (TOC) Screening Note ? ? ?Patient Details  ?Name: [name] ?Date of Birth: [birth year] ? ? ?Transition of Care (TOC) CM/SW Contact:    ?Marlowe Sax, RN ?Phone Number: ?04/01/2021, 9:28 AM ? ? ? ?Transition of Care Department [facility]) has reviewed patient and no TOC needs have been identified at this time. We will continue to monitor patient advancement through interdisciplinary progression rounds. If new patient transition needs arise, please place a TOC consult. ?  ? ? ?Expected Discharge Plan: Home/Self Care ?Barriers to Discharge: Continued Medical Work up ? ?Expected Discharge Plan and Services ?Expected Discharge Plan: Home/Self Care ?  ?  ?  ?  ?                ?  ?  ?  ?  ?  ?  ?  ?  ?  ?  ? ? ?Social Determinants of Health (SDOH) Interventions ?  ? ?Readmission Risk Interventions ?No flowsheet data found. ? ?

## 2021-04-01 NOTE — Plan of Care (Signed)

## 2021-04-02 ENCOUNTER — Observation Stay: Payer: Medicare Other

## 2021-04-02 ENCOUNTER — Encounter: Payer: Self-pay | Admitting: Osteopathic Medicine

## 2021-04-02 DIAGNOSIS — K921 Melena: Secondary | ICD-10-CM | POA: Diagnosis not present

## 2021-04-02 MED ORDER — PANTOPRAZOLE SODIUM 40 MG PO TBEC
40.0000 mg | DELAYED_RELEASE_TABLET | Freq: Every day | ORAL | Status: DC
  Administered 2021-04-02: 40 mg via ORAL
  Filled 2021-04-02: qty 1

## 2021-04-02 MED ORDER — IOHEXOL 350 MG/ML SOLN
100.0000 mL | Freq: Once | INTRAVENOUS | Status: AC | PRN
  Administered 2021-04-02: 100 mL via INTRAVENOUS

## 2021-04-02 NOTE — Discharge Instructions (Signed)
Try eat more FULL liquid diet for  few days ?HOLDING ASA till stools clear ?

## 2021-04-02 NOTE — Progress Notes (Signed)
Discharge Note:  ?Reviewed discharge instructions. Pt verbalized understanding. IV cath intact upon removal. PT discharged with all personal belongings. Staff wheeled pt out. Pt transported self to home.  ?

## 2021-04-02 NOTE — Discharge Summary (Signed)
Physician Discharge Summary   Patient: Donna Austin MRN: [id number] DOB: January 15, 1951  Admit date:     03/31/2021  Discharge date: 04/02/21  Discharge Physician: Fritzi Mandes   PCP: Sharyne Peach, MD   Recommendations at discharge:    F/u PCP in 1 week F/u Dr Tama Headings in 1-3 weeks  Discharge Diagnoses: Sigmoid diverticular bleed  Hospital Course:  Donna Austin is a 71 y.o. female the past medical history of anemia (follows/ w/ HemOnc) hypertension, hyperlipidemia, mixed connective tissue disease, discoid lupus, Raynaud's disease, osteoarthritis, interstitial lung disease, who presents to the emergency department 03/31/21 for rectal bleeding   CT abdomen 03/31/21:  1. Sigmoid diverticulosis but no active diverticulitis identified. 2. Dilated appearance of the ascending and transverse colon although a similar appearance is shown on prior exams including the scout image from CT chest dated 11/10/2020. This may represent a functional colonic dilatation, this tapers in the vicinity of the splenic flexure but without an obvious mass or mechanical cause for the tapering. There is also some mild caliber prominence of the terminal ileum. 3. Mildly dilated distal esophagus with air-fluid level, cannot exclude reflux or dysmotility. 4. Mildly enlarged right external iliac lymph nodes, reactive versus occult neoplastic process. 5. 3.6 by 2.2 cm simple appearing right ovarian/adnexal lesion. Recommend follow-up US in 6-12 months   hematochezia likely due to diverticular bleed chronic anemia -- patient's hemoglobin stays around 9.0 -- hemoglobin down to 8.0 -- no bright red blood per rectum today. Tolerating clear liquid diet -- discussed with Dr. Serita Sheller.I. who will see patient. Recommends continue monitor. He three blades consider CT annual abdomen. == Transfuse as needed -- patient had colonoscopy few months ago was normal except diverticular disease --CTA abdomen 1. No evidence of active  intraluminal hemorrhage in the small or large bowel. 2. Marked left colonic diverticulosis. 3. Marked gaseous distention of the right and transverse colon without bowel wall thickening or bowel obstruction, suggesting adynamic ileus. 4. Stable mild bilateral common iliac and bilateral external iliac lymphadenopathy, nonspecific. Follow-up CT abdomen/pelvis with oral IV contrast recommended in 3 months --hgb  8.2 --vitals stable   acute on chronic anemia with history of borderline B12 deficiency history of celiac disease/mixed connective tissue disorder -- B12 levels okay -- patient follows with hematology oncology as outpatient   Adnexal mass on CT ABdomen 03/31/21  Recommended f/u US in 6-12 mos  defer to PCP.  HTN --resumed home meds   Ok from GI standpoint for d/c Holding ASA for now Pt is in agreement with plan         Consultants: GI Procedures performed: none  Disposition: Home Diet recommendation:  Discharge Diet Orders (From admission, onward)     Start     Ordered   04/02/21 0000  Diet full liquid        04/02/21 1346           Cardiac diet DISCHARGE MEDICATION: Allergies as of 04/02/2021       Reactions   Gluten Meal Diarrhea   Methotrexate Derivatives Other (See Comments)   Neutropenia   Other    Staples (post operatively)--infection   Latex Rash        Medication List     STOP taking these medications    aspirin EC 81 MG tablet       TAKE these medications    acetaminophen 650 MG CR tablet Commonly known as: TYLENOL Take 650 mg by mouth every 8 (eight) hours as  needed for pain.   amLODipine 10 MG tablet Commonly known as: NORVASC Take 10 mg by mouth in the morning.   atorvastatin 20 MG tablet Commonly known as: LIPITOR Take 20 mg by mouth every Monday, Wednesday, and Friday at 8 PM.   beta carotene 25000 UNIT capsule Take 25,000 Units by mouth in the morning.   cholecalciferol 25 MCG (1000 UNIT) tablet Commonly  known as: VITAMIN D Take 1,000 Units by mouth in the morning.   fluticasone 50 MCG/ACT nasal spray Commonly known as: FLONASE Place 1 spray into both nostrils daily as needed for allergies or rhinitis.   furosemide 20 MG tablet Commonly known as: LASIX Take 20 mg by mouth daily.   hydrALAZINE 50 MG tablet Commonly known as: APRESOLINE Take 50 mg by mouth 2 (two) times daily.   hydrochlorothiazide 25 MG tablet Commonly known as: HYDRODIURIL Take 25 mg by mouth in the morning.   hydroxychloroquine 200 MG tablet Commonly known as: PLAQUENIL Take 200 mg by mouth in the morning.   irbesartan 300 MG tablet Commonly known as: AVAPRO Take 300 mg by mouth in the morning.   pantoprazole 20 MG tablet Commonly known as: PROTONIX Take 20 mg by mouth daily before breakfast.   predniSONE 5 MG tablet Commonly known as: DELTASONE Take 5 mg by mouth in the morning.   vitamin C 500 MG tablet Commonly known as: ASCORBIC ACID Take 500 mg by mouth in the morning.   vitamin E 180 MG (400 UNITS) capsule Take 400 Units by mouth in the morning.        Follow-up Information     Sharyne Peach, MD. Schedule an appointment as soon as possible for a visit in 1 week(s).   Specialty: Family Medicine Why: Rectal bleed Contact information: [place] [address] [phone]         Efrain Sella, MD. Schedule an appointment as soon as possible for a visit in 2 week(s).   Specialty: Gastroenterology Why: As needed for Diverticular bleed Contact information: [place] [address] [phone]                Discharge Exam: Danley Danker Weights   03/31/21 1332  Weight: 117 kg     Condition at discharge: fair  The results of significant diagnostics from this hospitalization (including imaging, microbiology, ancillary and laboratory) are listed below for reference.   Imaging Studies: CT ABDOMEN PELVIS W CONTRAST  Result Date:  03/31/2021 CLINICAL DATA:  Left lower quadrant abdominal pain. EXAM: CT ABDOMEN AND PELVIS WITH CONTRAST TECHNIQUE: Multidetector CT imaging of the abdomen and pelvis was performed using the standard protocol following bolus administration of intravenous contrast. RADIATION DOSE REDUCTION: This exam was performed according to the departmental dose-optimization program which includes automated exposure control, adjustment of the mA and/or kV according to patient size and/or use of iterative reconstruction technique. CONTRAST:  19mL OMNIPAQUE IOHEXOL 300 MG/ML  SOLN COMPARISON:  Abdominal ultrasound 10/25/2012 FINDINGS: Lower chest: Cylindrical bronchiectasis in both lower lobes. Atelectasis or scarring in the lingula and posterior basal segments of both lower lobes. Aortic and mitral valve calcifications. Thoracic aortic and right coronary artery atherosclerotic calcification. Mild cardiomegaly. Hepatobiliary: Contracted gallbladder. No focal hepatic parenchymal lesion identified. No biliary dilatation noted. Pancreas: Unremarkable Spleen: Unremarkable Adrenals/Urinary Tract: Unremarkable Stomach/Bowel: Mildly dilated distal esophagus with an air-fluid level. Cannot exclude esophageal reflux or dysmotility. Substantial to descending and sigmoid colon diverticulosis without findings of active diverticulitis. Gaseous prominence  of the mobile cecum, ascending colon, and transverse colon with transition to smaller caliber colon in the splenic flexure, but without an obvious mass lesion in this region. The transverse colon extends substantially anterior to the liver and stomach. Redundant transverse colon. Multiple clustered hyperdensities in the ascending colon, possibly pill fragments. Mild gaseous prominence of the distal most terminal ileum with some frothy stool-like material also in the distal ileum. Normal appendix. Looking back at the scout image for the CT chest from 11/10/2020, we similarly see an appearance of  substantial gaseous prominence of the upper colon. Accordingly this is not thought to be acute. Several air-fluid levels are present in nondilated loops of left upper quadrant small bowel for example on image 87 series 6. Vascular/Lymphatic: Atherosclerosis is present, including aortoiliac atherosclerotic disease. There is some atheromatous plaque proximally in the SMA and celiac trunk without complete occlusion of either vessel. Upper normal size 0.9 cm left common iliac lymph node on image 54 series 2. Right external iliac node is enlarged at 1.3 cm short axis on image 78 of series 2. Other right common iliac lymph nodes are likewise enlarged. A left common iliac node measures 0.8 cm in short axis on image 72 series 2, upper normal size. Reproductive: 3.6 by 2.2 cm simple appearing fluid density lesion of the right ovary/adnexa, image 76 series 2. Uterus absent. Other: No supplemental non-categorized findings. Musculoskeletal: Lower lumbar spondylosis and degenerative disc disease contributing to bilateral foraminal impingement at L3-4 and probable left foraminal impingement at L4-5. Suspected central narrowing of the thecal sac at L3-4 and L4-5. IMPRESSION: 1. Sigmoid diverticulosis but no active diverticulitis identified. 2. Dilated appearance of the ascending and transverse colon although a similar appearance is shown on prior exams including the scout image from CT chest dated 11/10/2020. This may represent a functional colonic dilatation, this tapers in the vicinity of the splenic flexure but without an obvious mass or mechanical cause for the tapering. There is also some mild caliber prominence of the terminal ileum. 3. Mildly dilated distal esophagus with air-fluid level, cannot exclude reflux or dysmotility. 4. Mildly enlarged right external iliac lymph nodes, reactive versus occult neoplastic process. 5. 3.6 by 2.2 cm simple appearing right ovarian/adnexal lesion. Recommend follow-up US in 6-12 months.  Note: This recommendation does not apply to premenarchal patients and to those with increased risk (genetic, family history, elevated tumor markers or other high-risk factors) of ovarian cancer. Reference: JACR 2020 Feb; 17(2):248-254 6. Other imaging findings of potential clinical significance: Cylindrical bronchiectasis in both lower lobes. Mild scarring in both lung bases. Aortic and mitral valve calcifications. Aortic Atherosclerosis (ICD10-I70.0). Coronary atherosclerosis. Mild cardiomegaly. Lower lumbar impingement. Electronically Signed   By: Van Clines M.D.   On: 03/31/2021 15:35   CT ANGIO GI BLEED  Result Date: 04/02/2021 CLINICAL DATA:  Inpatient.  Rectal bleed. EXAM: CTA ABDOMEN AND PELVIS WITHOUT AND WITH CONTRAST TECHNIQUE: Multidetector CT imaging of the abdomen and pelvis was performed using the standard protocol during bolus administration of intravenous contrast. Multiplanar reconstructed images and MIPs were obtained and reviewed to evaluate the vascular anatomy. RADIATION DOSE REDUCTION: This exam was performed according to the departmental dose-optimization program which includes automated exposure control, adjustment of the mA and/or kV according to patient size and/or use of iterative reconstruction technique. CONTRAST:  164mL OMNIPAQUE IOHEXOL 350 MG/ML SOLN COMPARISON:  03/31/2021 CT abdomen/pelvis. FINDINGS: Review of the MIP images confirms the above findings. Lower chest: Patchy subpleural reticulation, ground-glass opacity and mild traction bronchiectasis  with subpleural lines of both lung bases, not appreciably changed. No acute abnormality at the lung bases. Coronary atherosclerosis. Mild cardiomegaly. Hepatobiliary: Normal liver with no liver mass. Normal gallbladder with no radiopaque cholelithiasis. No biliary ductal dilatation. Pancreas: Normal, with no mass or duct dilation. Spleen: Normal size. No mass. Adrenals/Urinary Tract: Normal adrenals. Normal kidneys with no  hydronephrosis and no renal mass. No renal stones. Normal bladder. Stomach/Bowel: Normal non-distended stomach. Normal caliber small bowel with no small bowel wall thickening. Normal appendix. Marked gaseous distention of the right and transverse colon. Marked left colonic diverticulosis with no large bowel wall thickening or significant pericolonic fat stranding. Apparent undigested pill fragments layering in the right colon. No evidence of active intraluminal contrast extravasation in the small or large bowel. Vascular/Lymphatic: Atherosclerotic nonaneurysmal abdominal aorta. Patent portal, splenic, hepatic and renal veins. Mild bilateral common iliac and bilateral external iliac lymphadenopathy with representative 1.0 cm short axis diameter left common iliac node (series 16/image 143) and representative 1.3 cm right external iliac node (series 16/image 203), stable. No pathologically enlarged abdominal nodes. Reproductive: Status post hysterectomy, with no abnormal findings at the vaginal cuff. Simple 3.1 cm right adnexal cyst (series 16/image 196), stable. No left adnexal mass. Other: No pneumoperitoneum, ascites or focal fluid collection. Musculoskeletal: No aggressive appearing focal osseous lesions. Mild thoracolumbar spondylosis. IMPRESSION: 1. No evidence of active intraluminal hemorrhage in the small or large bowel. 2. Marked left colonic diverticulosis. 3. Marked gaseous distention of the right and transverse colon without bowel wall thickening or bowel obstruction, suggesting adynamic ileus. 4. Stable mild bilateral common iliac and bilateral external iliac lymphadenopathy, nonspecific. Follow-up CT abdomen/pelvis with oral IV contrast recommended in 3 months. This recommendation follows ACR consensus guidelines: White Paper of the ACR Incidental Findings Committee II on Splenic and Nodal Findings. J Am Coll Radiol 2013;10:789-794. 5. Stable nonspecific chronic interstitial lung disease at the lung  bases. 6. Mild cardiomegaly. Coronary atherosclerosis. 7. Simple 3.1 cm right adnexal cyst, stable. Recommend follow-up US in 6-12 months. Note: This recommendation does not apply to premenarchal patients and to those with increased risk (genetic, family history, elevated tumor markers or other high-risk factors) of ovarian cancer. Reference: JACR 2020 Feb; 17(2):248-254 8. Aortic Atherosclerosis (ICD10-I70.0). Electronically Signed   By: Ilona Sorrel M.D.   On: 04/02/2021 09:33    Microbiology: Results for orders placed or performed during the hospital encounter of 03/31/21  Resp Panel by RT-PCR (Flu A&B, Covid) Nasopharyngeal Swab     Status: None   Collection Time: 03/31/21  2:58 PM   Specimen: Nasopharyngeal Swab; Nasopharyngeal(NP) swabs in vial transport medium  Result Value Ref Range Status   SARS Coronavirus 2 by RT PCR NEGATIVE NEGATIVE Final    Comment: (NOTE) SARS-CoV-2 target nucleic acids are NOT DETECTED.  The SARS-CoV-2 RNA is generally detectable in upper respiratory specimens during the acute phase of infection. The lowest concentration of SARS-CoV-2 viral copies this assay can detect is 138 copies/mL. A negative result does not preclude SARS-Cov-2 infection and should not be used as the sole basis for treatment or other patient management decisions. A negative result may occur with  improper specimen collection/handling, submission of specimen other than nasopharyngeal swab, presence of viral mutation(s) within the areas targeted by this assay, and inadequate number of viral copies(<138 copies/mL). A negative result must be combined with clinical observations, patient history, and epidemiological information. The expected result is Negative.  Fact Sheet for Patients:  EntrepreneurPulse.com.au  Fact Sheet for Healthcare Providers:  IncredibleEmployment.be  This test is no t yet approved or cleared by the [place] and  has  been authorized for detection and/or diagnosis of SARS-CoV-2 by FDA under an Emergency Use Authorization (EUA). This EUA will remain  in effect (meaning this test can be used) for the duration of the COVID-19 declaration under Section 564(b)(1) of the Act, 21 U.S.C.section 360bbb-3(b)(1), unless the authorization is terminated  or revoked sooner.       Influenza A by PCR NEGATIVE NEGATIVE Final   Influenza B by PCR NEGATIVE NEGATIVE Final    Comment: (NOTE) The Xpert Xpress SARS-CoV-2/FLU/RSV plus assay is intended as an aid in the diagnosis of influenza from Nasopharyngeal swab specimens and should not be used as a sole basis for treatment. Nasal washings and aspirates are unacceptable for Xpert Xpress SARS-CoV-2/FLU/RSV testing.  Fact Sheet for Patients: EntrepreneurPulse.com.au  Fact Sheet for Healthcare Providers: IncredibleEmployment.be  This test is not yet approved or cleared by the [place] FDA and has been authorized for detection and/or diagnosis of SARS-CoV-2 by FDA under an Emergency Use Authorization (EUA). This EUA will remain in effect (meaning this test can be used) for the duration of the COVID-19 declaration under Section 564(b)(1) of the Act, 21 U.S.C. section 360bbb-3(b)(1), unless the authorization is terminated or revoked.  Performed at [facility], [address] [place], [place] [address]     Labs: CBC: Recent Labs  Lab 03/31/21 1334 03/31/21 1724 04/01/21 0448 04/01/21 1350  WBC 4.9 4.4 3.8*  --   HGB 8.5* 8.4* 8.0* 8.2*  HCT 26.4* 25.8* 24.8*  --   MCV 95.0 93.5 95.0  --   PLT 236 227 228  --    Basic Metabolic Panel: Recent Labs  Lab 03/31/21 1334  NA 138  K 3.7  CL 106  CO2 27  GLUCOSE 90  BUN 14  CREATININE 0.61  CALCIUM 9.3   Liver Function Tests: Recent Labs  Lab 03/31/21 1334  AST 16  ALT 12  ALKPHOS 47  BILITOT 0.4  PROT 7.2  ALBUMIN 3.4*   CBG: No results  for input(s): GLUCAP in the last 168 hours.  Discharge time spent: greater than 30 minutes.  Signed: Fritzi Mandes, MD [facility] 04/02/2021

## 2021-06-18 ENCOUNTER — Inpatient Hospital Stay: Payer: Medicare Other | Attending: Oncology

## 2021-06-18 DIAGNOSIS — N83201 Unspecified ovarian cyst, right side: Secondary | ICD-10-CM | POA: Insufficient documentation

## 2021-06-18 DIAGNOSIS — Z803 Family history of malignant neoplasm of breast: Secondary | ICD-10-CM | POA: Insufficient documentation

## 2021-06-18 DIAGNOSIS — Z90710 Acquired absence of both cervix and uterus: Secondary | ICD-10-CM | POA: Diagnosis not present

## 2021-06-18 DIAGNOSIS — M1990 Unspecified osteoarthritis, unspecified site: Secondary | ICD-10-CM | POA: Diagnosis not present

## 2021-06-18 DIAGNOSIS — I10 Essential (primary) hypertension: Secondary | ICD-10-CM | POA: Insufficient documentation

## 2021-06-18 DIAGNOSIS — E538 Deficiency of other specified B group vitamins: Secondary | ICD-10-CM | POA: Insufficient documentation

## 2021-06-18 DIAGNOSIS — D649 Anemia, unspecified: Secondary | ICD-10-CM | POA: Diagnosis not present

## 2021-06-18 DIAGNOSIS — D72819 Decreased white blood cell count, unspecified: Secondary | ICD-10-CM | POA: Diagnosis not present

## 2021-06-18 DIAGNOSIS — R591 Generalized enlarged lymph nodes: Secondary | ICD-10-CM | POA: Insufficient documentation

## 2021-06-18 DIAGNOSIS — I7300 Raynaud's syndrome without gangrene: Secondary | ICD-10-CM | POA: Insufficient documentation

## 2021-06-18 DIAGNOSIS — Z87891 Personal history of nicotine dependence: Secondary | ICD-10-CM | POA: Insufficient documentation

## 2021-06-18 LAB — COMPREHENSIVE METABOLIC PANEL
ALT: 13 U/L (ref 0–44)
AST: 18 U/L (ref 15–41)
Albumin: 3.6 g/dL (ref 3.5–5.0)
Alkaline Phosphatase: 50 U/L (ref 38–126)
Anion gap: 8 (ref 5–15)
BUN: 17 mg/dL (ref 8–23)
CO2: 26 mmol/L (ref 22–32)
Calcium: 9 mg/dL (ref 8.9–10.3)
Chloride: 104 mmol/L (ref 98–111)
Creatinine, Ser: 0.75 mg/dL (ref 0.44–1.00)
GFR, Estimated: >60 mL/min (ref >60)
Glucose, Bld: 97 mg/dL (ref 70–99)
Potassium: 3.7 mmol/L (ref 3.5–5.1)
Sodium: 138 mmol/L (ref 135–145)
Total Bilirubin: 0.4 mg/dL (ref 0.3–1.2)
Total Protein: 7.8 g/dL (ref 6.5–8.1)

## 2021-06-18 LAB — CBC
HCT: 28.7 % — ABNORMAL LOW (ref 36.0–46.0)
Hemoglobin: 9.4 g/dL — ABNORMAL LOW (ref 12.0–15.0)
MCH: 30.9 pg (ref 26.0–34.0)
MCHC: 32.8 g/dL (ref 30.0–36.0)
MCV: 94.4 fL (ref 80.0–100.0)
Platelets: 241 10*3/uL (ref 150–400)
RBC: 3.04 MIL/uL — ABNORMAL LOW (ref 3.87–5.11)
RDW: 14.6 % (ref 11.5–15.5)
WBC: 4.5 10*3/uL (ref 4.0–10.5)
nRBC: 0 % (ref 0.0–0.2)

## 2021-06-18 LAB — VITAMIN B12: Vitamin B-12: 576 pg/mL (ref 180–914)

## 2021-06-22 ENCOUNTER — Encounter: Payer: Self-pay | Admitting: Oncology

## 2021-06-22 ENCOUNTER — Inpatient Hospital Stay: Payer: Medicare Other | Admitting: Oncology

## 2021-06-22 VITALS — BP 174/98 | HR 89 | Temp 96.9°F | Ht 70.0 in | Wt 256.0 lb

## 2021-06-22 DIAGNOSIS — D72819 Decreased white blood cell count, unspecified: Secondary | ICD-10-CM | POA: Diagnosis not present

## 2021-06-22 DIAGNOSIS — R591 Generalized enlarged lymph nodes: Secondary | ICD-10-CM

## 2021-06-22 DIAGNOSIS — D539 Nutritional anemia, unspecified: Secondary | ICD-10-CM

## 2021-06-22 DIAGNOSIS — Z809 Family history of malignant neoplasm, unspecified: Secondary | ICD-10-CM

## 2021-06-22 DIAGNOSIS — D649 Anemia, unspecified: Secondary | ICD-10-CM | POA: Diagnosis not present

## 2021-06-22 DIAGNOSIS — E538 Deficiency of other specified B group vitamins: Secondary | ICD-10-CM

## 2021-06-22 DIAGNOSIS — N83201 Unspecified ovarian cyst, right side: Secondary | ICD-10-CM

## 2021-06-23 ENCOUNTER — Encounter: Payer: Self-pay | Admitting: Oncology

## 2021-06-23 DIAGNOSIS — E538 Deficiency of other specified B group vitamins: Secondary | ICD-10-CM | POA: Insufficient documentation

## 2021-06-23 DIAGNOSIS — Z809 Family history of malignant neoplasm, unspecified: Secondary | ICD-10-CM | POA: Insufficient documentation

## 2021-06-23 DIAGNOSIS — N83201 Unspecified ovarian cyst, right side: Secondary | ICD-10-CM | POA: Insufficient documentation

## 2021-06-23 NOTE — Progress Notes (Signed)
,Hematology/Oncology Progress note [phone] [phone]      Patient Care Team: Rayetta Humphrey, MD as PCP - General (Family Medicine)  REFERRING PROVIDER: Rayetta Humphrey, MD  CHIEF COMPLAINTS/REASON FOR VISIT:  Anemia and lymphadenopathy, B12 deficiency  HISTORY OF PRESENTING ILLNESS:   Donna Austin is a  71 y.o.  female with PMH listed below was seen in consultation at the request of  Rayetta Humphrey, MD  for evaluation of leukopenia and a large lymph node  Patient has history of mixed connective tissue disease, discoid lupus, Raynaud's disease, osteoarthritis and follows up with Dr. Gavin Potters.  Patient was previously on methotrexate treatment which was stopped due to neutropenia.  She is currently on Imuran, low-dose steroids and Plaquenil.  Patient had a CT chest done in February 2022 for evaluation of dyspnea on exertion. 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  No definite acute pulmonary abnormality.  Severe atherosclerosis.  Numerous lymph nodes are slightly more prominent in the number and a size 10 typically seen but nonspecific.  Patient also had a blood work done on 08/11/2020. CBC showed hemoglobin 10.3, MCV 99, platelet count 287, white count 5.4. Differential showed decreased absolute lymphocytes 0.46, increased monocyte percentage 19.4, decreased eosinophil percentage.  Normal ANC and neutrophil percentage. Patient denies any unintentional weight loss, fever, severe night sweats.  She endorses intermittent hot flash and sweating.   11/10/2020, CT chest without contrast showed nonspecific prominent but nonenlarged mediastinal lymph nodes are stable compared to prior study.  Favor benign etiology. Interstitial lung disease, slightly progressive compared to prior study.  Dilatation of pulmonary trunk concerning of associated pulmonary arterial hypertension.  Aortic  atherosclerosis  INTERVAL HISTORY Donna Austin is a 71 y.o. female who has above history reviewed by me today presents for follow up visit for management of anemia and lymphadenopathy, B12 deficiency Patient takes vitamin B complex. She reports feeling well today.  Denies any unintentional weight loss, night sweats, fever. Patient is on Imuran for mixed connective tissue disease. She recently had stool testing at the [place] which showed positive for tapeworm.  03/31/2021 - 04/02/2021, patient was admitted due to rectal bleeding.  CT abdomen 03/31/2021 showed sigmoid diverticulosis but no active ductulitis identified.  Dilated appearance of the ascending and transverse colon.  Mildly dilated distal esophagus.  Mildly enlarged right external iliac lymph nodes, reactive versus occult neoplastic process.  3.6 x 2.2 cm simple appearing right ovarian adnexal lesion.  Recommend follow-up ultrasound in 6 to 12 months.  Patient's hemoglobin went down to 8.  Hematochezia was felt to be secondary to diverticular bleeding.  Per note, she had colonoscopy showed diverticular disease.  Colonoscopy results were not available in our current EMR.   CTA abdomen showed no evidence of active intraluminal hemorrhage.  Again marked gaseous distention of the right and transverse colon without wall thickening or bowel obstruction.  Bilateral common iliac and bilateral external iliac lymphadenopathy. Patient was discharged after hemoglobin was stabilized.  Patient is aspirin was held.   Review of Systems  Constitutional:  Negative for appetite change, chills, fatigue and fever.  HENT:   Negative for hearing loss and voice change.   Eyes:  Negative for eye problems.  Respiratory:  Negative for chest tightness and cough.   Cardiovascular:  Negative for chest pain.  Gastrointestinal:  Negative for abdominal distention, abdominal pain and blood in stool.  Genitourinary:  Negative for difficulty  urinating and frequency.    Musculoskeletal:  Negative for arthralgias.  Skin:  Negative for itching and rash.  Neurological:  Negative for extremity weakness.  Hematological:  Negative for adenopathy.  Psychiatric/Behavioral:  Negative for confusion.    MEDICAL HISTORY:  Past Medical History:  Diagnosis Date   Anemia    Arthritis    Celiac disease    Connective tissue disease overlap syndrome (HCC)    Elevated CPK    Hyperlipidemia    Hypertension    Lupus (HCC)    Obesity    Raynaud's disease    Raynaud's disease    Sleep apnea     SURGICAL HISTORY: Past Surgical History:  Procedure Laterality Date   ABDOMINAL HYSTERECTOMY     COLONOSCOPY WITH PROPOFOL N/A 11/09/2015   Procedure: COLONOSCOPY WITH PROPOFOL;  Surgeon: Scot Jun, MD;  Location: [facility] ENDOSCOPY;  Service: Endoscopy;  Laterality: N/A;   RIGHT/LEFT HEART CATH AND CORONARY ANGIOGRAPHY N/A 06/10/2020   Procedure: RIGHT/LEFT HEART CATH AND CORONARY ANGIOGRAPHY;  Surgeon: Alwyn Pea, MD;  Location: [facility] INVASIVE CV LAB;  Service: Cardiovascular;  Laterality: N/A;    SOCIAL HISTORY: Patient lives in [place].  She works as a Systems developer for a company.  Plans to retire. Social History   Socioeconomic History   Marital status: Single    Spouse name: Not on file   Number of children: 1   Years of education: Not on file   Highest education level: Not on file  Occupational History   Not on file  Tobacco Use   Smoking status: Former   Smokeless tobacco: Never  Vaping Use   Vaping Use: Never used  Substance and Sexual Activity   Alcohol use: Yes    Comment: occasional   Drug use: No   Sexual activity: Not on file  Other Topics Concern   Not on file  Social History Narrative   Lives by herself. Son lives in [place] & sees pt. Often. 3 grandchildren    Social Determinants of Corporate investment banker Strain: Not on file  Food Insecurity: Not on file  Transportation Needs: Not on file  Physical Activity: Not on  file  Stress: Not on file  Social Connections: Not on file  Intimate Partner Violence: Not on file    FAMILY HISTORY: Family History  Problem Relation Age of Onset   Breast cancer Cousin    Breast cancer Maternal Grandmother    Breast cancer Sister 85    ALLERGIES:  is allergic to gluten meal, methotrexate derivatives, other, and latex.  MEDICATIONS:  Current Outpatient Medications  Medication Sig Dispense Refill   acetaminophen (TYLENOL) 650 MG CR tablet Take 650 mg by mouth every 8 (eight) hours as needed for pain.     amLODipine (NORVASC) 10 MG tablet Take 10 mg by mouth in the morning.     atorvastatin (LIPITOR) 20 MG tablet Take 20 mg by mouth every Monday, Wednesday, and Friday at 8 PM.     beta carotene 16109 UNIT capsule Take 25,000 Units by mouth in the morning.     cholecalciferol (VITAMIN D) 25 MCG (1000 UNIT) tablet Take 1,000 Units by mouth in the morning.     fluticasone (FLONASE) 50 MCG/ACT nasal spray Place 1 spray into both nostrils daily as needed for allergies or rhinitis.     furosemide (LASIX) 20 MG tablet Take 20 mg by mouth daily.     hydrALAZINE (APRESOLINE) 50 MG tablet Take 50 mg by mouth  2 (two) times daily.     hydrochlorothiazide (HYDRODIURIL) 25 MG tablet Take 25 mg by mouth in the morning.     hydroxychloroquine (PLAQUENIL) 200 MG tablet Take 200 mg by mouth in the morning.     irbesartan (AVAPRO) 300 MG tablet Take 300 mg by mouth in the morning.     pantoprazole (PROTONIX) 20 MG tablet Take 20 mg by mouth daily before breakfast.     predniSONE (DELTASONE) 5 MG tablet Take 5 mg by mouth in the morning.     vitamin C (ASCORBIC ACID) 500 MG tablet Take 500 mg by mouth in the morning.     vitamin E 180 MG (400 UNITS) capsule Take 400 Units by mouth in the morning.     No current facility-administered medications for this visit.     PHYSICAL EXAMINATION: ECOG PERFORMANCE STATUS: 1 - Symptomatic but completely ambulatory Vitals:   06/22/21 1324   BP: (!) 174/98  Pulse: 89  Temp: (!) 96.9 F (36.1 C)   Filed Weights   06/22/21 1324  Weight: 256 lb (116.1 kg)    Physical Exam Constitutional:      General: She is not in acute distress.    Appearance: She is obese.  HENT:     Head: Normocephalic and atraumatic.  Eyes:     General: No scleral icterus. Cardiovascular:     Rate and Rhythm: Normal rate and regular rhythm.     Heart sounds: Normal heart sounds.  Pulmonary:     Effort: Pulmonary effort is normal. No respiratory distress.     Breath sounds: No wheezing.  Abdominal:     General: Bowel sounds are normal. There is no distension.     Palpations: Abdomen is soft.  Musculoskeletal:        General: No deformity. Normal range of motion.     Cervical back: Normal range of motion and neck supple.  Skin:    General: Skin is warm and dry.     Findings: No erythema or rash.  Neurological:     Mental Status: She is alert and oriented to person, place, and time. Mental status is at baseline.     Cranial Nerves: No cranial nerve deficit.     Coordination: Coordination normal.  Psychiatric:        Mood and Affect: Mood normal.    LABORATORY DATA:  I have reviewed the data as listed Lab Results  Component Value Date   WBC 4.5 06/18/2021   HGB 9.4 (L) 06/18/2021   HCT 28.7 (L) 06/18/2021   MCV 94.4 06/18/2021   PLT 241 06/18/2021   Recent Labs    12/16/20 1017 03/31/21 1334 06/18/21 1331  NA 133* 138 138  K 3.7 3.7 3.7  CL 100 106 104  CO2 25 27 26   GLUCOSE 98 90 97  BUN 14 14 17   CREATININE 0.74 0.61 0.75  CALCIUM 9.0 9.3 9.0  GFRNONAA >60 >60 >60  PROT 8.4* 7.2 7.8  ALBUMIN 4.2 3.4* 3.6  AST 15 16 18   ALT 9 12 13   ALKPHOS 46 47 50  BILITOT 0.2* 0.4 0.4    Iron/TIBC/Ferritin/ %Sat    Component Value Date/Time   IRON 65 03/31/2021 1724   IRON 112 10/22/2012 1604   TIBC 284 03/31/2021 1724   TIBC 307 10/22/2012 1604   FERRITIN 37 03/31/2021 1724   FERRITIN 209 10/22/2012 1604   IRONPCTSAT  23 03/31/2021 1724   IRONPCTSAT 36 10/22/2012 1604  RADIOGRAPHIC STUDIES: I have personally reviewed the radiological images as listed and agreed with the findings in the report. No results found.    ASSESSMENT & PLAN:  1. Normocytic anemia   2. Lymphadenopathy   3. Vitamin B12 deficiency   4. Cyst of right ovary   5. Family history of cancer    # lymphadenopathy  Patient has persistent lymphadenopathy on multiple CT scans.  Likely secondary to autoimmune disease.  We discussed about other possibility of underlying low-grade lymphoma Currently she has no constitutional symptoms. I recommend observation.  We discussed about future PET scan and possible biopsy of representative lymph node to confirm diagnosis, or to proceed with above work-up when she develops symptoms.  Patient agrees with the plan.  # 3.6 x 2.2 cm simple appearing right ovarian adnexal lesion. Patient has family history of breast cancer in sister, maternal grandmother and cousin. We will obtain follow-up pelvic ultrasound for further evaluation.  #Borderline B12 level, patient has negative intrinsic factor antibodies and antiparietal antibody. Recommend patient to continue oral supplementation.  #Normocytic anemia, hemoglobin 9.7, improved comparing to the level during admission.  Still slightly lower than her baseline.  Multifactorial due to chronic blood loss, anemia due to chronic disease, Imuran medication side effects. Monitor.  Recommend patient to try oral iron supplementation ferrous sulfate 325 mg daily with meal.  We will check iron level at the next visit.  #Stool study positive for tapeworm.  This may contribute to her chronic blood loss.  Encourage patient to talk to primary care provider for treatments.  #Family history of breast cancer, I previously discussed with patient about genetic counselor referral/genetic testing.  She preferred to defer  Orders Placed This Encounter  Procedures   CBC  with Differential/Platelet    Standing Status:   Future    Standing Expiration Date:   06/23/2022   Comprehensive metabolic panel    Standing Status:   Future    Standing Expiration Date:   06/23/2022   Ferritin    Standing Status:   Future    Standing Expiration Date:   06/23/2022   Iron and TIBC    Standing Status:   Future    Standing Expiration Date:   06/23/2022   Lactate dehydrogenase    Standing Status:   Future    Standing Expiration Date:   06/23/2022    All questions were answered. The patient knows to call the clinic with any problems questions or concerns.  cc Rayetta Humphrey, MD    Return of visit:  6 months.    Rickard Patience, MD, PhD 06/23/2021

## 2021-08-31 ENCOUNTER — Other Ambulatory Visit: Payer: Self-pay | Admitting: Family Medicine

## 2021-08-31 DIAGNOSIS — Z1231 Encounter for screening mammogram for malignant neoplasm of breast: Secondary | ICD-10-CM

## 2021-09-23 ENCOUNTER — Ambulatory Visit
Admission: RE | Admit: 2021-09-23 | Discharge: 2021-09-23 | Disposition: A | Discharge status: Home / Self Care | Payer: Medicare Other | Source: Ambulatory Visit | Attending: Family Medicine | Admitting: Family Medicine

## 2021-09-23 DIAGNOSIS — Z1231 Encounter for screening mammogram for malignant neoplasm of breast: Secondary | ICD-10-CM | POA: Insufficient documentation

## 2021-12-19 ENCOUNTER — Emergency Department: Payer: Medicare Other

## 2021-12-19 ENCOUNTER — Other Ambulatory Visit: Payer: Self-pay

## 2021-12-19 DIAGNOSIS — K5900 Constipation, unspecified: Secondary | ICD-10-CM | POA: Diagnosis not present

## 2021-12-19 DIAGNOSIS — I10 Essential (primary) hypertension: Secondary | ICD-10-CM | POA: Diagnosis not present

## 2021-12-19 DIAGNOSIS — K5730 Diverticulosis of large intestine without perforation or abscess without bleeding: Secondary | ICD-10-CM | POA: Insufficient documentation

## 2021-12-19 DIAGNOSIS — R1032 Left lower quadrant pain: Secondary | ICD-10-CM | POA: Diagnosis present

## 2021-12-19 LAB — URINALYSIS, ROUTINE W REFLEX MICROSCOPIC
Bacteria, UA: NONE SEEN
Bilirubin Urine: NEGATIVE
Glucose, UA: NEGATIVE mg/dL
Hgb urine dipstick: NEGATIVE
Ketones, ur: NEGATIVE mg/dL
Leukocytes,Ua: NEGATIVE
Nitrite: NEGATIVE
Protein, ur: 30 mg/dL — AB
Specific Gravity, Urine: 1.014 (ref 1.005–1.030)
pH: 6 (ref 5.0–8.0)

## 2021-12-19 LAB — CBC WITH DIFFERENTIAL/PLATELET
Abs Immature Granulocytes: 0.02 10*3/uL (ref 0.00–0.07)
Basophils Absolute: 0 10*3/uL (ref 0.0–0.1)
Basophils Relative: 0 %
Eosinophils Absolute: 0 10*3/uL (ref 0.0–0.5)
Eosinophils Relative: 0 %
HCT: 27.7 % — ABNORMAL LOW (ref 36.0–46.0)
Hemoglobin: 9.3 g/dL — ABNORMAL LOW (ref 12.0–15.0)
Immature Granulocytes: 1 %
Lymphocytes Relative: 12 %
Lymphs Abs: 0.5 10*3/uL — ABNORMAL LOW (ref 0.7–4.0)
MCH: 30.5 pg (ref 26.0–34.0)
MCHC: 33.6 g/dL (ref 30.0–36.0)
MCV: 90.8 fL (ref 80.0–100.0)
Monocytes Absolute: 0.6 10*3/uL (ref 0.1–1.0)
Monocytes Relative: 15 %
Neutro Abs: 2.8 10*3/uL (ref 1.7–7.7)
Neutrophils Relative %: 72 %
Platelets: 280 10*3/uL (ref 150–400)
RBC: 3.05 MIL/uL — ABNORMAL LOW (ref 3.87–5.11)
RDW: 14.5 % (ref 11.5–15.5)
WBC: 3.9 10*3/uL — ABNORMAL LOW (ref 4.0–10.5)
nRBC: 0 % (ref 0.0–0.2)

## 2021-12-19 LAB — COMPREHENSIVE METABOLIC PANEL
ALT: 12 U/L (ref 0–44)
AST: 17 U/L (ref 15–41)
Albumin: 3.9 g/dL (ref 3.5–5.0)
Alkaline Phosphatase: 54 U/L (ref 38–126)
Anion gap: 9 (ref 5–15)
BUN: 14 mg/dL (ref 8–23)
CO2: 25 mmol/L (ref 22–32)
Calcium: 9.5 mg/dL (ref 8.9–10.3)
Chloride: 100 mmol/L (ref 98–111)
Creatinine, Ser: 0.63 mg/dL (ref 0.44–1.00)
GFR, Estimated: >60 mL/min (ref >60)
Glucose, Bld: 123 mg/dL — ABNORMAL HIGH (ref 70–99)
Potassium: 3.3 mmol/L — ABNORMAL LOW (ref 3.5–5.1)
Sodium: 134 mmol/L — ABNORMAL LOW (ref 135–145)
Total Bilirubin: 0.7 mg/dL (ref 0.3–1.2)
Total Protein: 8.2 g/dL — ABNORMAL HIGH (ref 6.5–8.1)

## 2021-12-19 LAB — LIPASE, BLOOD: Lipase: 36 U/L (ref 11–51)

## 2021-12-19 NOTE — ED Triage Notes (Signed)
Pt to ED for left sided flank pain that started when she got up this morning. Pt denies any difficulty urinating. Pt last BM x 2 days ago. Pt denies fever or body aches. Pt CAOx4 and in no acute distress at this time. Denies CP and SOB.

## 2021-12-19 NOTE — ED Provider Triage Note (Addendum)
  Emergency Medicine Provider Triage Evaluation Note  Donna Austin , a 71 y.o.female,  was evaluated in triage.  Pt complains of left-sided flank pain.  Patient states that it started when she got up this morning.  Denies any urinary symptoms at this time.  Last bowel movement was 2 days ago.  She does state that she has been having episodes of nausea and dry heaving.  Denies any other symptoms at this time.   Review of Systems  Positive: Left-sided flank pain, nausea. Negative: Denies fever, chest pain, vomiting  Physical Exam  There were no vitals filed for this visit. Gen:   Awake, no distress   Resp:  Normal effort  MSK:   Moves extremities without difficulty  Other:  No significant abdominal tenderness.  Medical Decision Making  Given the patient's initial medical screening exam, the following diagnostic evaluation has been ordered. The patient will be placed in the appropriate treatment space, once one is available, to complete the evaluation and treatment. I have discussed the plan of care with the patient and I have advised the patient that an ED physician or mid-level practitioner will reevaluate their condition after the test results have been received, as the results may give them additional insight into the type of treatment they may need.    Diagnostics: Labs, CT renal, UA  Treatments: none immediately   Varney Daily, PA 12/19/21 1827    Varney Daily, [place] 12/19/21 1827

## 2021-12-20 ENCOUNTER — Emergency Department
Admission: EM | Admit: 2021-12-20 | Discharge: 2021-12-20 | Disposition: A | Discharge status: Home / Self Care | Payer: Medicare Other | Attending: Emergency Medicine | Admitting: Emergency Medicine

## 2021-12-20 DIAGNOSIS — K5730 Diverticulosis of large intestine without perforation or abscess without bleeding: Secondary | ICD-10-CM

## 2021-12-20 DIAGNOSIS — K5900 Constipation, unspecified: Secondary | ICD-10-CM

## 2021-12-20 DIAGNOSIS — R109 Unspecified abdominal pain: Secondary | ICD-10-CM

## 2021-12-20 DIAGNOSIS — R10A2 Flank pain, left side: Secondary | ICD-10-CM

## 2021-12-20 NOTE — Discharge Instructions (Signed)
Please use MiraLAX one half capful every hour until your first bowel movement.  Please do not take any MiraLAX after this for at least 24 hours.  You may use one half capful twice a day of MiraLAX in order to have 1 solid well-formed bowel movement per day.  You may increase or decrease this dosage as needed to obtain this 1 well-formed bowel movement.  Please make sure that you are drinking at least 8 ounces of water every hour during this initial bowel regimen. ?

## 2021-12-20 NOTE — ED Provider Notes (Signed)
[facility] [place] [facility] Provider Note   Event Date/Time   First MD Initiated Contact with Patient 12/20/21 0003     (approximate) History  Abdominal Pain  HPI Donna Austin is a 71 y.o. female with stated past medical history of celiac disease, hypertension, and hyperlipidemia who presents for left flank pain that began when she got up this morning and is described as 6/10, aching pain that radiates around to the front part of her abdomen.  Patient denies any exacerbating or relieving factors for this pain.  Patient denies any change with food.  Patient states this pain has remained stable throughout the day.  Patient states last bowel movement 2 days prior to arrival and was hard stool. ROS: Patient currently denies any vision changes, tinnitus, difficulty speaking, facial droop, sore throat, chest pain, shortness of breath, nausea/vomiting/diarrhea, dysuria, or weakness/numbness/paresthesias in any extremity   Physical Exam  Triage Vital Signs: ED Triage Vitals  Enc Vitals Group     BP 12/19/21 1828 (!) 170/84     Pulse Rate 12/19/21 1828 62     Resp 12/19/21 1828 16     Temp 12/19/21 1828 98.9 F (37.2 C)     Temp Source 12/19/21 1828 Oral     SpO2 12/19/21 1828 98 %     Weight 12/19/21 1824 235 lb (106.6 kg)     Height 12/19/21 1824 5\' 10"  (1.778 m)     Head Circumference --      Peak Flow --      Pain Score 12/19/21 1824 6     Pain Loc --      Pain Edu? --      Excl. in [place]? --    Most recent vital signs: Vitals:   12/19/21 1828 12/19/21 2237  BP: (!) 170/84 (!) 174/80  Pulse: 62 65  Resp: 16 17  Temp: 98.9 F (37.2 C) 98 F (36.7 C)  SpO2: 98% 100%   General: Awake, oriented x4. CV:  Good peripheral perfusion.  Resp:  Normal effort.  Abd:  No distention.  Mild left upper and lower quadrant tenderness to palpation Other:  Elderly obese African-American female laying in bed in no acute distress ED Results / Procedures / Treatments  Labs (all labs  ordered are listed, but only abnormal results are displayed) Labs Reviewed  COMPREHENSIVE METABOLIC PANEL - Abnormal; Notable for the following components:      Result Value   Sodium 134 (*)    Potassium 3.3 (*)    Glucose, Bld 123 (*)    Total Protein 8.2 (*)    All other components within normal limits  CBC WITH DIFFERENTIAL/PLATELET - Abnormal; Notable for the following components:   WBC 3.9 (*)    RBC 3.05 (*)    Hemoglobin 9.3 (*)    HCT 27.7 (*)    Lymphs Abs 0.5 (*)    All other components within normal limits  URINALYSIS, ROUTINE W REFLEX MICROSCOPIC - Abnormal; Notable for the following components:   Color, Urine YELLOW (*)    APPearance CLEAR (*)    Protein, ur 30 (*)    All other components within normal limits  LIPASE, BLOOD   RADIOLOGY ED MD interpretation: CT of the abdomen and pelvis without IV contrast shows markedly distended stomach and patulous transverse colon seen on prior examinations.  There is also descending and sigmoid diverticulosis without evidence of diverticulitis.  No acute abnormalities -Agree with radiology assessment Official radiology report(s): CT Renal Stone  Study  Result Date: 12/19/2021 CLINICAL DATA:  Left-sided abdominal and flank pain since this morning EXAM: CT ABDOMEN AND PELVIS WITHOUT CONTRAST TECHNIQUE: Multidetector CT imaging of the abdomen and pelvis was performed following the standard protocol without IV contrast. RADIATION DOSE REDUCTION: This exam was performed according to the departmental dose-optimization program which includes automated exposure control, adjustment of the mA and/or kV according to patient size and/or use of iterative reconstruction technique. COMPARISON:  04/02/2021 FINDINGS: Lower chest: No acute abnormality. Coronary artery calcifications. Scarring and fibrotic change in the bilateral lung bases. Hepatobiliary: No solid liver abnormality is seen. No gallstones, gallbladder wall thickening, or biliary  dilatation. Pancreas: Unremarkable. No pancreatic ductal dilatation or surrounding inflammatory changes. Spleen: Normal in size without significant abnormality. Adrenals/Urinary Tract: Adrenal glands are unremarkable. Kidneys are normal, without renal calculi, solid lesion, or hydronephrosis. Bladder is unremarkable. Stomach/Bowel: Unchanged, markedly distended stomach. Normal appendix. Unchanged patulous transverse colon. Gas and stool are present to the rectum. Descending and sigmoid diverticulosis. No inflammatory findings. Vascular/Lymphatic: Aortic atherosclerosis. No enlarged abdominal or pelvic lymph nodes. Reproductive: Status post hysterectomy. Unchanged right ovarian cyst measuring 3.4 x 2.1 cm (series 2, image 68). Other: No abdominal wall hernia or abnormality. No ascites. Musculoskeletal: No acute or significant osseous findings. IMPRESSION: 1. No acute noncontrast CT findings of the abdomen or pelvis to explain left-sided abdominal pain. No urinary tract calculi or hydronephrosis. 2. Descending and sigmoid diverticulosis without evidence of acute diverticulitis. 3. 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 As previously reported, no obvious mass or other obstipating etiology identified involving either the splenic flexure of the colon or gastric outlet. 4. Unchanged right ovarian cyst measuring 3.4 x 2.1 cm, presumed benign given stability. 5. Coronary artery disease. Aortic Atherosclerosis (ICD10-I70.0). Electronically Signed   By: Delanna Ahmadi M.D.   On: 12/19/2021 19:18   PROCEDURES: Critical Care performed: No .1-3 Lead EKG Interpretation  Performed by: Naaman Plummer, MD Authorized by: Naaman Plummer, MD     Interpretation: normal     ECG rate:  66   ECG rate assessment: normal     Rhythm: sinus  rhythm     Ectopy: none     Conduction: normal    MEDICATIONS ORDERED IN ED: Medications - No data to display IMPRESSION / MDM / [place] / ED COURSE  I reviewed the triage vital signs and the nursing notes.                             The patient is on the cardiac monitor to evaluate for evidence of arrhythmia and/or significant heart rate changes. Patient's presentation is most consistent with acute presentation with potential threat to life or bodily function. Patients symptoms not typical for emergent causes of abdominal pain such as, but not limited to, appendicitis, abdominal aortic aneurysm, surgical biliary disease, pancreatitis, SBO, mesenteric ischemia, serious intra-abdominal bacterial illness. Presentation also not typical of gynecologic emergencies such as TOA, Ovarian Torsion, PID. Not Ectopic. Doubt atypical ACS.  Pt tolerating PO. Disposition: Patient will be discharged with strict return precautions and follow up with primary MD within 12-24 hours for further evaluation. Patient understands that this still may have an early presentation of an emergent medical condition such as appendicitis that will require a recheck.   FINAL CLINICAL IMPRESSION(S) /  ED DIAGNOSES   Final diagnoses:  Left flank pain  Constipation, unspecified constipation type  Diverticulosis of sigmoid colon   Rx / DC Orders   ED Discharge Orders     None      Note:  This document was prepared using Dragon voice recognition software and may include unintentional dictation errors.   Merwyn Katos, MD 12/20/21 [phone]

## 2021-12-22 ENCOUNTER — Inpatient Hospital Stay: Payer: Medicare Other | Attending: Oncology

## 2021-12-22 DIAGNOSIS — Z87891 Personal history of nicotine dependence: Secondary | ICD-10-CM | POA: Insufficient documentation

## 2021-12-22 DIAGNOSIS — D649 Anemia, unspecified: Secondary | ICD-10-CM | POA: Diagnosis not present

## 2021-12-22 DIAGNOSIS — R591 Generalized enlarged lymph nodes: Secondary | ICD-10-CM | POA: Diagnosis not present

## 2021-12-22 DIAGNOSIS — J849 Interstitial pulmonary disease, unspecified: Secondary | ICD-10-CM | POA: Diagnosis not present

## 2021-12-22 DIAGNOSIS — Z90710 Acquired absence of both cervix and uterus: Secondary | ICD-10-CM | POA: Insufficient documentation

## 2021-12-22 DIAGNOSIS — N83201 Unspecified ovarian cyst, right side: Secondary | ICD-10-CM | POA: Insufficient documentation

## 2021-12-22 DIAGNOSIS — I10 Essential (primary) hypertension: Secondary | ICD-10-CM | POA: Insufficient documentation

## 2021-12-22 DIAGNOSIS — Z803 Family history of malignant neoplasm of breast: Secondary | ICD-10-CM | POA: Diagnosis not present

## 2021-12-22 DIAGNOSIS — E538 Deficiency of other specified B group vitamins: Secondary | ICD-10-CM | POA: Insufficient documentation

## 2021-12-22 DIAGNOSIS — E871 Hypo-osmolality and hyponatremia: Secondary | ICD-10-CM | POA: Diagnosis not present

## 2021-12-22 LAB — CBC WITH DIFFERENTIAL/PLATELET
Abs Immature Granulocytes: 0.01 10*3/uL (ref 0.00–0.07)
Basophils Absolute: 0 10*3/uL (ref 0.0–0.1)
Basophils Relative: 0 %
Eosinophils Absolute: 0 10*3/uL (ref 0.0–0.5)
Eosinophils Relative: 0 %
HCT: 29.6 % — ABNORMAL LOW (ref 36.0–46.0)
Hemoglobin: 10 g/dL — ABNORMAL LOW (ref 12.0–15.0)
Immature Granulocytes: 0 %
Lymphocytes Relative: 15 %
Lymphs Abs: 0.5 10*3/uL — ABNORMAL LOW (ref 0.7–4.0)
MCH: 30.8 pg (ref 26.0–34.0)
MCHC: 33.8 g/dL (ref 30.0–36.0)
MCV: 91.1 fL (ref 80.0–100.0)
Monocytes Absolute: 0.9 10*3/uL (ref 0.1–1.0)
Monocytes Relative: 30 %
Neutro Abs: 1.7 10*3/uL (ref 1.7–7.7)
Neutrophils Relative %: 55 %
Platelets: 271 10*3/uL (ref 150–400)
RBC: 3.25 MIL/uL — ABNORMAL LOW (ref 3.87–5.11)
RDW: 14.3 % (ref 11.5–15.5)
WBC: 3.1 10*3/uL — ABNORMAL LOW (ref 4.0–10.5)
nRBC: 0 % (ref 0.0–0.2)

## 2021-12-22 LAB — IRON AND TIBC
Iron: 63 ug/dL (ref 28–170)
Saturation Ratios: 22 % (ref 10.4–31.8)
TIBC: 288 ug/dL (ref 250–450)
UIBC: 225 ug/dL

## 2021-12-22 LAB — COMPREHENSIVE METABOLIC PANEL
ALT: 13 U/L (ref 0–44)
AST: 19 U/L (ref 15–41)
Albumin: 3.6 g/dL (ref 3.5–5.0)
Alkaline Phosphatase: 55 U/L (ref 38–126)
Anion gap: 8 (ref 5–15)
BUN: 7 mg/dL — ABNORMAL LOW (ref 8–23)
CO2: 27 mmol/L (ref 22–32)
Calcium: 8.9 mg/dL (ref 8.9–10.3)
Chloride: 91 mmol/L — ABNORMAL LOW (ref 98–111)
Creatinine, Ser: 0.53 mg/dL (ref 0.44–1.00)
GFR, Estimated: >60 mL/min (ref >60)
Glucose, Bld: 101 mg/dL — ABNORMAL HIGH (ref 70–99)
Potassium: 4.1 mmol/L (ref 3.5–5.1)
Sodium: 126 mmol/L — ABNORMAL LOW (ref 135–145)
Total Bilirubin: 0.3 mg/dL (ref 0.3–1.2)
Total Protein: 7.8 g/dL (ref 6.5–8.1)

## 2021-12-22 LAB — FERRITIN: Ferritin: 53 ng/mL (ref 11–307)

## 2021-12-22 LAB — LACTATE DEHYDROGENASE: LDH: 129 U/L (ref 98–192)

## 2021-12-23 ENCOUNTER — Encounter: Payer: Self-pay | Admitting: Oncology

## 2021-12-23 ENCOUNTER — Inpatient Hospital Stay: Payer: Medicare Other | Admitting: Oncology

## 2021-12-23 VITALS — BP 154/82 | HR 74 | Temp 97.4°F | Resp 18 | Wt 219.8 lb

## 2021-12-23 DIAGNOSIS — N9489 Other specified conditions associated with female genital organs and menstrual cycle: Secondary | ICD-10-CM

## 2021-12-23 DIAGNOSIS — R591 Generalized enlarged lymph nodes: Secondary | ICD-10-CM | POA: Diagnosis not present

## 2021-12-23 DIAGNOSIS — D649 Anemia, unspecified: Secondary | ICD-10-CM | POA: Diagnosis not present

## 2021-12-23 DIAGNOSIS — E538 Deficiency of other specified B group vitamins: Secondary | ICD-10-CM | POA: Diagnosis not present

## 2021-12-23 DIAGNOSIS — Z809 Family history of malignant neoplasm, unspecified: Secondary | ICD-10-CM

## 2021-12-23 DIAGNOSIS — E871 Hypo-osmolality and hyponatremia: Secondary | ICD-10-CM

## 2021-12-23 MED ORDER — VITAMIN B-12 1000 MCG PO TABS: 1000.0000 ug | ORAL_TABLET | Freq: Every day | ORAL | 1 refills | Status: AC

## 2021-12-23 NOTE — Progress Notes (Signed)
Pt here for follow up. Pt will have an endoscopy at [facility]

## 2021-12-24 DIAGNOSIS — E871 Hypo-osmolality and hyponatremia: Secondary | ICD-10-CM | POA: Insufficient documentation

## 2021-12-24 NOTE — Assessment & Plan Note (Signed)
I previously discussed with patient about genetic counselor referral/genetic testing.  She preferred to defer

## 2021-12-24 NOTE — Assessment & Plan Note (Addendum)
#   lymphadenopathy  Patient has persistent lymphadenopathy on multiple CT scans.  Likely secondary to autoimmune disease.  We discussed about other possibility of underlying low-grade lymphoma Current repeat CT renal scan showed stable lymph node size. I spoke with radiologist who will add addendum.  We discussed about future PET scan and possible biopsy of representative lymph node to confirm diagnosis, or continue observation and initiate work up when she develops symptoms.  Patient elected to continue surveillance.

## 2021-12-24 NOTE — Assessment & Plan Note (Signed)
patient has negative intrinsic factor antibodies and antiparietal antibody. Recommend patient to continue oral supplementation.

## 2021-12-24 NOTE — Assessment & Plan Note (Addendum)
Hemoglobin has improved after she takes oral iron supplementation.  She has experienced constipation due to iron supplementation.  Okay to stop iron supplementation.   Multifactorial due to anemia secondary to chronic disease, Imuran medication side effects.  #Stool study positive for tapeworm.  This may contribute to her chronic blood loss.  Encourage patient to talk to primary care provider for treatments.

## 2021-12-24 NOTE — Assessment & Plan Note (Signed)
Sodium 124.  Patient is on hydrochlorothiazide, possibly medication side effects.  I called her on 12/24/2021 and she is not available.  Will ask team RN to reach out next week.  Recommend patient to further discuss with primary care provider.

## 2021-12-24 NOTE — Assessment & Plan Note (Signed)
3.6 x 2.2 cm simple appearing right ovarian adnexal lesion. Patient has family history of breast cancer in sister, maternal grandmother and cousin. Recent CT scan showed unchanged right ovarian cyst measuring 3.4 x 2.1 cm, presumed benign given stability.

## 2021-12-24 NOTE — Progress Notes (Signed)
,Hematology/Oncology Progress note [phone] [phone]      Patient Care Team: Rayetta Humphrey, MD as PCP - General (Family Medicine)  ASSESSMENT & PLAN:   Lymphadenopathy # lymphadenopathy  Patient has persistent lymphadenopathy on multiple CT scans.  Likely secondary to autoimmune disease.  We discussed about other possibility of underlying low-grade lymphoma Current repeat CT renal scan showed stable lymph node size. I spoke with radiologist who will add addendum.  We discussed about future PET scan and possible biopsy of representative lymph node to confirm diagnosis, or continue observation and initiate work up when she develops symptoms.  Patient elected to continue surveillance.   Adnexal mass on CT ABdomen 03/31/21  3.6 x 2.2 cm simple appearing right ovarian adnexal lesion. Patient has family history of breast cancer in sister, maternal grandmother and cousin. Recent CT scan showed unchanged right ovarian cyst measuring 3.4 x 2.1 cm, presumed benign given stability.  Vitamin B12 deficiency patient has negative intrinsic factor antibodies and antiparietal antibody. Recommend patient to continue oral supplementation.  Normocytic anemia Hemoglobin has improved after she takes oral iron supplementation.  She has experienced constipation due to iron supplementation.  Okay to stop iron supplementation.   Multifactorial due to anemia secondary to chronic disease, Imuran medication side effects.  #Stool study positive for tapeworm.  This may contribute to her chronic blood loss.  Encourage patient to talk to primary care provider for treatments.  Hyponatremia Sodium 124.  Patient is on hydrochlorothiazide, possibly medication side effects.  I called her on 12/24/2021 and she is not available.  Will ask team RN to reach out next week.  Recommend patient to further discuss with primary care provider.  Family history of cancer I previously discussed with  patient about genetic counselor referral/genetic testing.  She preferred to defer   Orders Placed This Encounter  Procedures   CBC with Differential/Platelet    Standing Status:   Future    Standing Expiration Date:   12/24/2022   Comprehensive metabolic panel    Standing Status:   Future    Standing Expiration Date:   12/23/2022   Vitamin B12    Standing Status:   Future    Standing Expiration Date:   12/24/2022   Iron and TIBC    Standing Status:   Future    Standing Expiration Date:   12/24/2022   Ferritin    Standing Status:   Future    Standing Expiration Date:   12/24/2022   Retic Panel    Standing Status:   Future    Standing Expiration Date:   12/24/2022   Lactate dehydrogenase    Standing Status:   Future    Standing Expiration Date:   12/24/2022   Follow up in 6 months. All questions were answered. The patient knows to call the clinic with any problems, questions or concerns.  Rickard Patience, MD, PhD [facility] Hematology Oncology 12/23/2021   CHIEF COMPLAINTS/REASON FOR VISIT:  Anemia and lymphadenopathy, B12 deficiency  HISTORY OF PRESENTING ILLNESS:   Donna Austin is a  71 y.o.  female with PMH listed below was seen in consultation at the request of  Rayetta Humphrey, MD  for evaluation of leukopenia and a large lymph node  Patient has history of mixed connective tissue disease, discoid lupus, Raynaud's disease, osteoarthritis and follows up with Dr. Gavin Potters.  Patient was previously on methotrexate treatment which was stopped due to neutropenia.  She is currently on Imuran, low-dose steroids and  Plaquenil.  Patient had a CT chest done in February 2022 for evaluation of dyspnea on exertion. 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  No definite acute pulmonary abnormality.  Severe atherosclerosis.  Numerous lymph nodes are slightly more prominent in the number and a size 10 typically seen  but nonspecific.  Patient also had a blood work done on 08/11/2020. CBC showed hemoglobin 10.3, MCV 99, platelet count 287, white count 5.4. Differential showed decreased absolute lymphocytes 0.46, increased monocyte percentage 19.4, decreased eosinophil percentage.  Normal ANC and neutrophil percentage. Patient denies any unintentional weight loss, fever, severe night sweats.  She endorses intermittent hot flash and sweating.   11/10/2020, CT chest without contrast showed nonspecific prominent but nonenlarged mediastinal lymph nodes are stable compared to prior study.  Favor benign etiology. Interstitial lung disease, slightly progressive compared to prior study.  Dilatation of pulmonary trunk concerning of associated pulmonary arterial hypertension.  Aortic atherosclerosis  INTERVAL HISTORY Donna Austin is a 71 y.o. female who has above history reviewed by me today presents for follow up visit for management of anemia and lymphadenopathy, B12 deficiency Patient takes vitamin B complex. She reports feeling well today.  Denies any unintentional weight loss, night sweats, fever. Patient is on Imuran for mixed connective tissue disease. She recently had stool testing at the [place] which showed positive for tapeworm.  03/31/2021 - 04/02/2021, patient was admitted due to rectal bleeding.  CT abdomen 03/31/2021 showed sigmoid diverticulosis but no active ductulitis identified.  Dilated appearance of the ascending and transverse colon.  Mildly dilated distal esophagus.  Mildly enlarged right external iliac lymph nodes, reactive versus occult neoplastic process.  3.6 x 2.2 cm simple appearing right ovarian adnexal lesion.  Recommend follow-up ultrasound in 6 to 12 months.  Patient's hemoglobin went down to 8.  Hematochezia was felt to be secondary to diverticular bleeding.  Per note, she had colonoscopy showed diverticular disease.  Colonoscopy results were not available in our current EMR.   CTA abdomen showed  no evidence of active intraluminal hemorrhage.  Again marked gaseous distention of the right and transverse colon without wall thickening or bowel obstruction.  Bilateral common iliac and bilateral external iliac lymphadenopathy. Patient was discharged after hemoglobin was stabilized.  Patient is aspirin was held.   Review of Systems  Constitutional:  Negative for appetite change, chills, fatigue and fever.  HENT:   Negative for hearing loss and voice change.   Eyes:  Negative for eye problems.  Respiratory:  Negative for chest tightness and cough.   Cardiovascular:  Negative for chest pain.  Gastrointestinal:  Negative for abdominal distention, abdominal pain and blood in stool.  Genitourinary:  Negative for difficulty urinating and frequency.   Musculoskeletal:  Negative for arthralgias.  Skin:  Negative for itching and rash.  Neurological:  Negative for extremity weakness.  Hematological:  Negative for adenopathy.  Psychiatric/Behavioral:  Negative for confusion.     MEDICAL HISTORY:  Past Medical History:  Diagnosis Date   Anemia    Arthritis    Celiac disease    Connective tissue disease overlap syndrome (HCC)    Elevated CPK    Hyperlipidemia    Hypertension    Lupus (HCC)    Obesity    Raynaud's disease    Raynaud's disease    Sleep apnea     SURGICAL HISTORY: Past Surgical History:  Procedure Laterality Date   ABDOMINAL HYSTERECTOMY  COLONOSCOPY WITH PROPOFOL N/A 11/09/2015   Procedure: COLONOSCOPY WITH PROPOFOL;  Surgeon: Scot Jun, MD;  Location: [facility] ENDOSCOPY;  Service: Endoscopy;  Laterality: N/A;   RIGHT/LEFT HEART CATH AND CORONARY ANGIOGRAPHY N/A 06/10/2020   Procedure: RIGHT/LEFT HEART CATH AND CORONARY ANGIOGRAPHY;  Surgeon: Alwyn Pea, MD;  Location: [facility] INVASIVE CV LAB;  Service: Cardiovascular;  Laterality: N/A;    SOCIAL HISTORY: Patient lives in [place].  She works as a Systems developer for a company.  Plans to retire. Social History    Socioeconomic History   Marital status: Single    Spouse name: Not on file   Number of children: 1   Years of education: Not on file   Highest education level: Not on file  Occupational History   Not on file  Tobacco Use   Smoking status: Former   Smokeless tobacco: Never  Vaping Use   Vaping Use: Never used  Substance and Sexual Activity   Alcohol use: Yes    Comment: occasional   Drug use: No   Sexual activity: Not on file  Other Topics Concern   Not on file  Social History Narrative   Lives by herself. Son lives in [place] & sees pt. Often. 3 grandchildren    Social Determinants of Corporate investment banker Strain: Not on file  Food Insecurity: Not on file  Transportation Needs: Not on file  Physical Activity: Not on file  Stress: Not on file  Social Connections: Not on file  Intimate Partner Violence: Not on file    FAMILY HISTORY: Family History  Problem Relation Age of Onset   Breast cancer Cousin    Breast cancer Maternal Grandmother    Breast cancer Sister 56    ALLERGIES:  is allergic to gluten meal, methotrexate derivatives, other, and latex.  MEDICATIONS:  Current Outpatient Medications  Medication Sig Dispense Refill   acetaminophen (TYLENOL) 650 MG CR tablet Take 650 mg by mouth every 8 (eight) hours as needed for pain.     amLODipine (NORVASC) 10 MG tablet Take 10 mg by mouth in the morning.     atorvastatin (LIPITOR) 20 MG tablet Take 20 mg by mouth every Monday, Wednesday, and Friday at 8 PM.     azaTHIOprine (IMURAN) 50 MG tablet Take 100 mg by mouth daily.     cholecalciferol (VITAMIN D) 25 MCG (1000 UNIT) tablet Take 1,000 Units by mouth in the morning.     cyanocobalamin (VITAMIN B12) 1000 MCG tablet Take 1 tablet (1,000 mcg total) by mouth daily. 90 tablet 1   fluticasone (FLONASE) 50 MCG/ACT nasal spray Place 1 spray into both nostrils daily as needed for allergies or rhinitis.     furosemide (LASIX) 20 MG tablet Take 20 mg by mouth  daily.     hydrALAZINE (APRESOLINE) 50 MG tablet Take 50 mg by mouth 2 (two) times daily.     hydrochlorothiazide (HYDRODIURIL) 25 MG tablet Take 25 mg by mouth in the morning.     hydroxychloroquine (PLAQUENIL) 200 MG tablet Take 200 mg by mouth in the morning.     irbesartan (AVAPRO) 300 MG tablet Take 300 mg by mouth in the morning.     pantoprazole (PROTONIX) 20 MG tablet Take 20 mg by mouth daily before breakfast.     predniSONE (DELTASONE) 1 MG tablet Take 4 mg by mouth daily.     vitamin C (ASCORBIC ACID) 500 MG tablet Take 500 mg by mouth in the morning.  vitamin E 180 MG (400 UNITS) capsule Take 400 Units by mouth in the morning.     No current facility-administered medications for this visit.     PHYSICAL EXAMINATION: ECOG PERFORMANCE STATUS: 1 - Symptomatic but completely ambulatory Vitals:   12/23/21 1412  BP: (!) 154/82  Pulse: 74  Resp: 18  Temp: (!) 97.4 F (36.3 C)   Filed Weights   12/23/21 1412  Weight: 99.7 kg    Physical Exam Constitutional:      General: She is not in acute distress.    Appearance: She is obese.  HENT:     Head: Normocephalic and atraumatic.  Eyes:     General: No scleral icterus. Cardiovascular:     Rate and Rhythm: Normal rate and regular rhythm.     Heart sounds: Normal heart sounds.  Pulmonary:     Effort: Pulmonary effort is normal. No respiratory distress.     Breath sounds: No wheezing.  Abdominal:     General: Bowel sounds are normal. There is no distension.     Palpations: Abdomen is soft.  Musculoskeletal:        General: No deformity. Normal range of motion.     Cervical back: Normal range of motion and neck supple.  Skin:    General: Skin is warm and dry.     Findings: No erythema or rash.  Neurological:     Mental Status: She is alert and oriented to person, place, and time. Mental status is at baseline.     Cranial Nerves: No cranial nerve deficit.     Coordination: Coordination normal.  Psychiatric:         Mood and Affect: Mood normal.     LABORATORY DATA:  I have reviewed the data as listed Lab Results  Component Value Date   WBC 3.1 (L) 12/22/2021   HGB 10.0 (L) 12/22/2021   HCT 29.6 (L) 12/22/2021   MCV 91.1 12/22/2021   PLT 271 12/22/2021   Recent Labs    06/18/21 1331 12/19/21 1827 12/22/21 1110  NA 138 134* 126*  K 3.7 3.3* 4.1  CL 104 100 91*  CO2 26 25 27   GLUCOSE 97 123* 101*  BUN 17 14 7*  CREATININE 0.75 0.63 0.53  CALCIUM 9.0 9.5 8.9  GFRNONAA >60 >60 >60  PROT 7.8 8.2* 7.8  ALBUMIN 3.6 3.9 3.6  AST 18 17 19   ALT 13 12 13   ALKPHOS 50 54 55  BILITOT 0.4 0.7 0.3    Iron/TIBC/Ferritin/ %Sat    Component Value Date/Time   IRON 63 12/22/2021 1110   IRON 112 10/22/2012 1604   TIBC 288 12/22/2021 1110   TIBC 307 10/22/2012 1604   FERRITIN 53 12/22/2021 1110   FERRITIN 209 10/22/2012 1604   IRONPCTSAT 22 12/22/2021 1110   IRONPCTSAT 36 10/22/2012 1604      RADIOGRAPHIC STUDIES: I have personally reviewed the radiological images as listed and agreed with the findings in the report. CT Renal Stone Study  Result Date: 12/19/2021 CLINICAL DATA:  Left-sided abdominal and flank pain since this morning EXAM: CT ABDOMEN AND PELVIS WITHOUT CONTRAST TECHNIQUE: Multidetector CT imaging of the abdomen and pelvis was performed following the standard protocol without IV contrast. RADIATION DOSE REDUCTION: This exam was performed according to the departmental dose-optimization program which includes automated exposure control, adjustment of the mA and/or kV according to patient size and/or use of iterative reconstruction technique. COMPARISON:  04/02/2021 FINDINGS: Lower chest: No acute abnormality. Coronary artery calcifications.  Scarring and fibrotic change in the bilateral lung bases. Hepatobiliary: No solid liver abnormality is seen. No gallstones, gallbladder wall thickening, or biliary dilatation. Pancreas: Unremarkable. No pancreatic ductal dilatation or surrounding  inflammatory changes. Spleen: Normal in size without significant abnormality. Adrenals/Urinary Tract: Adrenal glands are unremarkable. Kidneys are normal, without renal calculi, solid lesion, or hydronephrosis. Bladder is unremarkable. Stomach/Bowel: Unchanged, markedly distended stomach. Normal appendix. Unchanged patulous transverse colon. Gas and stool are present to the rectum. Descending and sigmoid diverticulosis. No inflammatory findings. Vascular/Lymphatic: Aortic atherosclerosis. No enlarged abdominal or pelvic lymph nodes. Reproductive: Status post hysterectomy. Unchanged right ovarian cyst measuring 3.4 x 2.1 cm (series 2, image 68). Other: No abdominal wall hernia or abnormality. No ascites. Musculoskeletal: No acute or significant osseous findings. IMPRESSION: 1. No acute noncontrast CT findings of the abdomen or pelvis to explain left-sided abdominal pain. No urinary tract calculi or hydronephrosis. 2. Descending and sigmoid diverticulosis without evidence of acute diverticulitis. 3. 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 As previously reported, no obvious mass or other obstipating etiology identified involving either the splenic flexure of the colon or gastric outlet. 4. Unchanged right ovarian cyst measuring 3.4 x 2.1 cm, presumed benign given stability. 5. Coronary artery disease. Aortic Atherosclerosis (ICD10-I70.0). Electronically Signed   By: Jearld Lesch M.D.   On: 12/19/2021 19:18

## 2021-12-27 ENCOUNTER — Telehealth: Payer: Self-pay

## 2021-12-27 NOTE — Telephone Encounter (Signed)
unable to reach pt by phone. Detailed VM left on ph. Copy of Lab result faxed to Dr. Greggory Stallion at [facility] PCP.

## 2021-12-27 NOTE — Telephone Encounter (Signed)
-----   Message from Rickard Patience, MD sent at 12/24/2021  7:53 PM EST ----- Please let her know that her sodium level is low. Encourage oral hydration.  Low sodium level could be due to her blood pressure med, hydrochlorothiazide. I recommend her to further discuss with her pcp.  Please send a copy of result to her pcp

## 2022-02-26 ENCOUNTER — Encounter: Payer: Self-pay | Admitting: Oncology

## 2022-02-26 ENCOUNTER — Emergency Department: Payer: Medicare Other

## 2022-02-26 ENCOUNTER — Emergency Department
Admission: EM | Admit: 2022-02-26 | Discharge: 2022-02-26 | Disposition: A | Discharge status: Other Acute Inpatient Hospital | Payer: Medicare Other | Attending: Emergency Medicine | Admitting: Emergency Medicine

## 2022-02-26 DIAGNOSIS — D649 Anemia, unspecified: Secondary | ICD-10-CM | POA: Insufficient documentation

## 2022-02-26 DIAGNOSIS — B3781 Candidal esophagitis: Secondary | ICD-10-CM | POA: Diagnosis not present

## 2022-02-26 DIAGNOSIS — R718 Other abnormality of red blood cells: Secondary | ICD-10-CM

## 2022-02-26 DIAGNOSIS — Z91040 Latex allergy status: Secondary | ICD-10-CM | POA: Diagnosis not present

## 2022-02-26 DIAGNOSIS — R509 Fever, unspecified: Secondary | ICD-10-CM | POA: Insufficient documentation

## 2022-02-26 DIAGNOSIS — D594 Other nonautoimmune hemolytic anemias: Secondary | ICD-10-CM

## 2022-02-26 DIAGNOSIS — I10 Essential (primary) hypertension: Secondary | ICD-10-CM | POA: Insufficient documentation

## 2022-02-26 DIAGNOSIS — Z87891 Personal history of nicotine dependence: Secondary | ICD-10-CM | POA: Insufficient documentation

## 2022-02-26 DIAGNOSIS — I7300 Raynaud's syndrome without gangrene: Secondary | ICD-10-CM | POA: Diagnosis not present

## 2022-02-26 DIAGNOSIS — R112 Nausea with vomiting, unspecified: Secondary | ICD-10-CM

## 2022-02-26 DIAGNOSIS — Z1152 Encounter for screening for COVID-19: Secondary | ICD-10-CM | POA: Insufficient documentation

## 2022-02-26 DIAGNOSIS — K900 Celiac disease: Secondary | ICD-10-CM | POA: Insufficient documentation

## 2022-02-26 DIAGNOSIS — D696 Thrombocytopenia, unspecified: Secondary | ICD-10-CM

## 2022-02-26 DIAGNOSIS — D61818 Other pancytopenia: Secondary | ICD-10-CM | POA: Diagnosis not present

## 2022-02-26 DIAGNOSIS — R935 Abnormal findings on diagnostic imaging of other abdominal regions, including retroperitoneum: Secondary | ICD-10-CM | POA: Diagnosis not present

## 2022-02-26 DIAGNOSIS — R197 Diarrhea, unspecified: Secondary | ICD-10-CM

## 2022-02-26 LAB — CBC WITH DIFFERENTIAL/PLATELET
Abs Immature Granulocytes: 0.36 10*3/uL — ABNORMAL HIGH (ref 0.00–0.07)
Basophils Absolute: 0 10*3/uL (ref 0.0–0.1)
Basophils Relative: 0 %
Eosinophils Absolute: 0 10*3/uL (ref 0.0–0.5)
Eosinophils Relative: 0 %
HCT: 14.6 % — CL (ref 36.0–46.0)
Hemoglobin: 4.7 g/dL — CL (ref 12.0–15.0)
Immature Granulocytes: 5 %
Lymphocytes Relative: 9 %
Lymphs Abs: 0.6 10*3/uL — ABNORMAL LOW (ref 0.7–4.0)
MCH: 32.2 pg (ref 26.0–34.0)
MCHC: 32.2 g/dL (ref 30.0–36.0)
MCV: 100 fL (ref 80.0–100.0)
Monocytes Absolute: 1.3 10*3/uL — ABNORMAL HIGH (ref 0.1–1.0)
Monocytes Relative: 18 %
Neutro Abs: 4.9 10*3/uL (ref 1.7–7.7)
Neutrophils Relative %: 68 %
Platelets: 5 10*3/uL — CL (ref 150–400)
RBC: 1.46 MIL/uL — ABNORMAL LOW (ref 3.87–5.11)
RDW: 24.3 % — ABNORMAL HIGH (ref 11.5–15.5)
Smear Review: NORMAL
WBC: 7.2 10*3/uL (ref 4.0–10.5)
nRBC: 20.2 % — ABNORMAL HIGH (ref 0.0–0.2)

## 2022-02-26 LAB — COMPREHENSIVE METABOLIC PANEL
ALT: 35 U/L (ref 0–44)
AST: 67 U/L — ABNORMAL HIGH (ref 15–41)
Albumin: 3.5 g/dL (ref 3.5–5.0)
Alkaline Phosphatase: 55 U/L (ref 38–126)
Anion gap: 10 (ref 5–15)
BUN: 26 mg/dL — ABNORMAL HIGH (ref 8–23)
CO2: 23 mmol/L (ref 22–32)
Calcium: 9 mg/dL (ref 8.9–10.3)
Chloride: 99 mmol/L (ref 98–111)
Creatinine, Ser: 0.92 mg/dL (ref 0.44–1.00)
GFR, Estimated: >60 mL/min (ref >60)
Glucose, Bld: 133 mg/dL — ABNORMAL HIGH (ref 70–99)
Potassium: 4.2 mmol/L (ref 3.5–5.1)
Sodium: 132 mmol/L — ABNORMAL LOW (ref 135–145)
Total Bilirubin: 1.7 mg/dL — ABNORMAL HIGH (ref 0.3–1.2)
Total Protein: 7.4 g/dL (ref 6.5–8.1)

## 2022-02-26 LAB — PATHOLOGIST SMEAR REVIEW

## 2022-02-26 LAB — RETIC PANEL
Immature Retic Fract: 44.9 % — ABNORMAL HIGH (ref 2.3–15.9)
RBC.: 1.45 MIL/uL — ABNORMAL LOW (ref 3.87–5.11)
Retic Count, Absolute: 133.7 10*3/uL (ref 19.0–186.0)
Retic Ct Pct: 9.2 % — ABNORMAL HIGH (ref 0.4–3.1)
Reticulocyte Hemoglobin: 34.5 pg (ref >27.9)

## 2022-02-26 LAB — BILIRUBIN, FRACTIONATED(TOT/DIR/INDIR)
Bilirubin, Direct: 0.4 mg/dL — ABNORMAL HIGH (ref 0.0–0.2)
Indirect Bilirubin: 1.1 mg/dL — ABNORMAL HIGH (ref 0.3–0.9)
Total Bilirubin: 1.5 mg/dL — ABNORMAL HIGH (ref 0.3–1.2)

## 2022-02-26 LAB — URINALYSIS, ROUTINE W REFLEX MICROSCOPIC
Bacteria, UA: NONE SEEN
Bilirubin Urine: NEGATIVE
Glucose, UA: NEGATIVE mg/dL
Ketones, ur: NEGATIVE mg/dL
Nitrite: NEGATIVE
Protein, ur: 100 mg/dL — AB
Specific Gravity, Urine: >1.046 — ABNORMAL HIGH (ref 1.005–1.030)
pH: 5 (ref 5.0–8.0)

## 2022-02-26 LAB — TROPONIN I (HIGH SENSITIVITY)
Troponin I (High Sensitivity): 829 ng/L (ref <18)
Troponin I (High Sensitivity): 951 ng/L (ref <18)

## 2022-02-26 LAB — FOLATE: Folate: 29 ng/mL (ref >5.9)

## 2022-02-26 LAB — IRON AND TIBC
Iron: 185 ug/dL — ABNORMAL HIGH (ref 28–170)
Saturation Ratios: 65 % — ABNORMAL HIGH (ref 10.4–31.8)
TIBC: 284 ug/dL (ref 250–450)
UIBC: 99 ug/dL

## 2022-02-26 LAB — VITAMIN B12: Vitamin B-12: 743 pg/mL (ref 180–914)

## 2022-02-26 LAB — PROTIME-INR
INR: 1.4 — ABNORMAL HIGH (ref 0.8–1.2)
Prothrombin Time: 16.6 seconds — ABNORMAL HIGH (ref 11.4–15.2)

## 2022-02-26 LAB — LACTATE DEHYDROGENASE: LDH: 1161 U/L — ABNORMAL HIGH (ref 98–192)

## 2022-02-26 LAB — IMMATURE PLATELET FRACTION: Immature Platelet Fraction: 7.4 % (ref 1.2–8.6)

## 2022-02-26 LAB — RESP PANEL BY RT-PCR (RSV, FLU A&B, COVID)  RVPGX2
Influenza A by PCR: NEGATIVE
Influenza B by PCR: NEGATIVE
Resp Syncytial Virus by PCR: NEGATIVE
SARS Coronavirus 2 by RT PCR: NEGATIVE

## 2022-02-26 LAB — FERRITIN: Ferritin: 1344 ng/mL — ABNORMAL HIGH (ref 11–307)

## 2022-02-26 LAB — FIBRINOGEN: Fibrinogen: 351 mg/dL (ref 210–475)

## 2022-02-26 LAB — D-DIMER, QUANTITATIVE: D-Dimer, Quant: 8.39 ug/mL-FEU — ABNORMAL HIGH (ref 0.00–0.50)

## 2022-02-26 LAB — APTT: aPTT: 38 seconds — ABNORMAL HIGH (ref 24–36)

## 2022-02-26 LAB — LIPASE, BLOOD: Lipase: 58 U/L — ABNORMAL HIGH (ref 11–51)

## 2022-02-26 LAB — DAT, POLYSPECIFIC AHG (ARMC ONLY): Polyspecific AHG test: NEGATIVE

## 2022-02-26 MED ORDER — PANTOPRAZOLE SODIUM 40 MG IV SOLR
40.0000 mg | Freq: Once | INTRAVENOUS | Status: AC
  Administered 2022-02-26: 40 mg via INTRAVENOUS
  Filled 2022-02-26: qty 10

## 2022-02-26 MED ORDER — ONDANSETRON HCL 4 MG/2ML IJ SOLN
4.0000 mg | Freq: Once | INTRAMUSCULAR | Status: AC
  Administered 2022-02-26: 4 mg via INTRAVENOUS
  Filled 2022-02-26: qty 2

## 2022-02-26 MED ORDER — SODIUM CHLORIDE 0.9 % IV SOLN
10.0000 mL/h | Freq: Once | INTRAVENOUS | Status: AC
  Administered 2022-02-26: 10 mL/h via INTRAVENOUS

## 2022-02-26 MED ORDER — FLUCONAZOLE 100 MG PO TABS: 200.0000 mg | ORAL_TABLET | Freq: Every day | ORAL | 0 refills | Status: AC

## 2022-02-26 MED ORDER — ONDANSETRON 4 MG PO TBDP: 4.0000 mg | ORAL_TABLET | Freq: Three times a day (TID) | ORAL | 0 refills | Status: DC | PRN

## 2022-02-26 MED ORDER — SODIUM CHLORIDE 0.9 % IV SOLN: 10.0000 mL/h | Freq: Once | INTRAVENOUS | Status: DC

## 2022-02-26 MED ORDER — FLUCONAZOLE 100 MG PO TABS
400.0000 mg | ORAL_TABLET | Freq: Once | ORAL | Status: AC
  Administered 2022-02-26: 400 mg via ORAL
  Filled 2022-02-26: qty 4

## 2022-02-26 MED ORDER — SODIUM CHLORIDE 0.9 % IV BOLUS
1000.0000 mL | Freq: Once | INTRAVENOUS | Status: AC
  Administered 2022-02-26: 1000 mL via INTRAVENOUS

## 2022-02-26 MED ORDER — IOHEXOL 350 MG/ML SOLN
75.0000 mL | Freq: Once | INTRAVENOUS | Status: AC | PRN
  Administered 2022-02-26: 125 mL via INTRAVENOUS

## 2022-02-26 MED ORDER — FAMOTIDINE 20 MG PO TABS: 20.0000 mg | ORAL_TABLET | Freq: Two times a day (BID) | ORAL | 0 refills | Status: DC

## 2022-02-26 NOTE — ED Notes (Signed)
Platelets started

## 2022-02-26 NOTE — ED Notes (Signed)
This RN called son x2 with no answer, pt asked if this RN could call sister Dell Ponto did answer and will be coming shortly.

## 2022-02-26 NOTE — ED Provider Notes (Signed)
House supervisor at [facility] was called by our hospital health supervisor, they advised they are unable to provide plasma exchange therapy on the weekend.  I have started transfer request to [facility] [place], MD 02/26/22 1032

## 2022-02-26 NOTE — ED Notes (Signed)
Report to [facility] at [place]

## 2022-02-26 NOTE — ED Notes (Signed)
Blood infusing at this time with no issues, pt awake and alert, pt denies any reaction at this time. VSS.   Pt does endorse HX of anemia and states she  takes iron every other day. Pt unsure of GI bleed due to black stool from iron, pt denies blood thinner use. Pt denies any bloody vomit.

## 2022-02-26 NOTE — ED Notes (Signed)
MD aware of initial trop of 829.

## 2022-02-26 NOTE — ED Notes (Signed)
Report to [place], RN at [facility], pt going to [place]

## 2022-02-26 NOTE — ED Notes (Signed)
This RN spoke to [facility] at [employer]  who states they will dispatch a truck and transport pt to [facility]. Report given.

## 2022-02-26 NOTE — ED Provider Notes (Signed)
Patient resting.  Ports mild nausea, reports that seem to worsen after she took the fluconazole tablet.  Of note her labs are concerning for a significantly elevated troponin.  Patient however endorses having no chest pain no shortness of breath.  She is however postprocedure, and I discussed with her and we will proceed with obtaining a second troponin as well as a CT scan to evaluate for causes such as pulmonary embolism, severe esophagitis, developing mediastinitis, or acute intra-abdominal etiology such as perforation etc.  Seems highly unlikely that cannot otherwise explain her significantly elevated troponin.  Her EKG shows mild nonspecific abnormality but there is no STEMI.  She does not endorse chest pain  She does report mild nausea.  Will give additional Zofran.  QT is normal.  ----------------------------------------- 10:15 AM on 02/26/2022 ----------------------------------------- Hemoglobin 4.7 critical platelet count of 5.  Additionally patient's troponin 951.  Schistocytes present on smear, updated Dr. Tasia Catchings (via [facility] chat, and she advises she is on her way to provide bedside consult in ED stat)   Delman Kitten, MD 02/26/22 1016

## 2022-02-26 NOTE — ED Notes (Signed)
This RN contacted [facility] transport to help with transport they stated transport is possible but not until later tonight. This RN spoke to [place].

## 2022-02-26 NOTE — ED Notes (Signed)
Verbal consent obtained from MD

## 2022-02-26 NOTE — ED Notes (Signed)
Pt to CT

## 2022-02-26 NOTE — ED Provider Notes (Signed)
Waiting patient to obtain consent for blood transfusion and platelets.  It appears to be emergently indicated at this time.  I have discussed with Dr. Tasia Catchings, of oncology.  She advises the pathologist will be on his way to review the patient's peripheral smear shortly.  Additionally she is added additional tests.  Oncology advising to transfuse platelets and blood cells as appropriate.  Currently awaiting CT GI bleed study and CT chest to evaluate for possible acute complication, bleeding in the chest abdomen pelvis or other body cavity.  The patient is alert fully oriented, there is no reported headaches or neurologic symptoms.  Patient's hemodynamics are stable but her blood counts have been confirmed twice by the lab to be severely anemic with thrombocytopenia.  There did not appear to be any sort of laboratory or sampling error.  Hematology has been consulted and is following, and pathologist on way to review smear.   Delman Kitten, MD 02/26/22 [phone]

## 2022-02-26 NOTE — ED Notes (Signed)
Lab called this RN and asked to repeat CBC, CBC redrew and sent.    Pt denies any recent CP or arm pain, pt does endorse weakness. Pt denies SOB.

## 2022-02-26 NOTE — ED Provider Notes (Signed)
[facility] Provider Note    Event Date/Time   First MD Initiated Contact with Patient 02/26/22 [phone]     (approximate)   History   Nausea, Emesis, and Diarrhea (Patient C/O nausea, vomiting, and diarrhea that began on Thursday)   HPI  Donna Austin is a 72 y.o. female with past medical history significant for celiac's disease, hypertension, hyperlipidemia, who presents to the emergency department with nausea, vomiting and diarrhea.  States that she has not been feeling well since Thursday with nausea vomiting and started having worsening diarrhea on Friday.  Upper abdominal pain.  States that she had a recent endoscopy and saw on her MyChart that she tested positive for Candida esophagitis.  Denies any alcohol use or marijuana use.  Denies any nicotine or tobacco use.  No blood in her stool.  Not on anticoagulation.     Physical Exam   Triage Vital Signs: ED Triage Vitals  Enc Vitals Group     BP      Pulse      Resp      Temp      Temp src      SpO2      Weight      Height      Head Circumference      [place] Flow      Pain Score      Pain Loc      Pain Edu?      Excl. in [place]?     Most recent vital signs: Vitals:   02/26/22 0609  BP: 134/78  Pulse: 89  Resp: 18  Temp: 98.1 F (36.7 C)  SpO2: 100%    Physical Exam Constitutional:      Appearance: She is well-developed.  HENT:     Head: Atraumatic.  Eyes:     Conjunctiva/sclera: Conjunctivae normal.  Cardiovascular:     Rate and Rhythm: Regular rhythm.  Pulmonary:     Effort: No respiratory distress.  Abdominal:     General: There is no distension.     Tenderness: There is abdominal tenderness (Epigastric).  Musculoskeletal:        General: Normal range of motion.     Cervical back: Normal range of motion.  Skin:    General: Skin is warm.  Neurological:     Mental Status: She is alert. Mental status is at baseline.     IMPRESSION / MDM / ASSESSMENT AND PLAN / ED COURSE   I reviewed the triage vital signs and the nursing notes.  Differential diagnosis including viral gastroenteritis, viral illness including COVID/influenza, electrolyte abnormality, dehydration, candidal esophagitis, ACS, pneumonia   EKG  No tachycardic or bradycardic dysrhythmias while on cardiac telemetry.    LABS (all labs ordered are listed, but only abnormal results are displayed) Labs interpreted as -    Labs Reviewed  RESP PANEL BY RT-PCR (RSV, FLU A&B, COVID)  [id number]  CBC WITH DIFFERENTIAL/PLATELET  COMPREHENSIVE METABOLIC PANEL  LIPASE, BLOOD  URINALYSIS, ROUTINE W REFLEX MICROSCOPIC  TROPONIN I (HIGH SENSITIVITY)    TREATMENT  1 L of IV fluids, IV Protonix, IV Zofran and p.o. fluconazole 400 mg tablet for initial treatment of her esophageal candidiasis.  MDM  Will check lab work and reevaluate.  If reassuring will discharge the patient with PPI, Zofran and fluconazole and follow-up with gastroenterology and primary care provider.     PROCEDURES:  Critical Care performed: No  Procedures  Patient's presentation is most consistent with  acute presentation with potential threat to life or bodily function.   MEDICATIONS ORDERED IN ED: Medications  sodium chloride 0.9 % bolus 1,000 mL (has no administration in time range)  pantoprazole (PROTONIX) injection 40 mg (has no administration in time range)  ondansetron (ZOFRAN) injection 4 mg (has no administration in time range)  fluconazole (DIFLUCAN) tablet 400 mg (has no administration in time range)    FINAL CLINICAL IMPRESSION(S) / ED DIAGNOSES   Final diagnoses:  Esophageal candidiasis (HCC)  Nausea vomiting and diarrhea     Rx / DC Orders   ED Discharge Orders          Ordered    fluconazole (DIFLUCAN) 100 MG tablet  Daily        02/26/22 0656    famotidine (PEPCID) 20 MG tablet  2 times daily        02/26/22 0656    ondansetron (ZOFRAN-ODT) 4 MG disintegrating tablet  Every 8 hours PRN         02/26/22 0656             Note:  This document was prepared using Dragon voice recognition software and may include unintentional dictation errors.   Nathaniel Man, MD 02/26/22 [phone]

## 2022-02-26 NOTE — ED Provider Notes (Signed)
Lab called and reports that hemoglobin is 4.7, platelet count of 5.  Nurse advised and verified the lab has checked this twice and the patient's platelet count is 5.   Donna Kitten, MD 02/26/22 [phone]

## 2022-02-26 NOTE — ED Notes (Signed)
Paper consent signed and filed with paper chart.  E-sig pad not working at this time.

## 2022-02-26 NOTE — ED Provider Notes (Signed)
Discussed risks and benefits of blood transfusion with the patient around 20 to 30 minutes ago.  Patient verbally consents to receiving blood and platelets.  Discussed risks and benefits including risk of infection, cross reaction, allergic reaction, etc. versus alternatives including no transfusion.  It appears at this point the patient is at a critical juncture and transfusion would be for life-threatening anemia and severely low platelet count.  Patient is understanding and agrees to proceed with transfusion.  Clearly medically indicated   Delman Kitten, MD 02/26/22 1014

## 2022-02-26 NOTE — Consult Note (Signed)
Hematology/Oncology Consult note [phone] [phone]      Patient Care Team: Rayetta HumphreyGeorge, Sionne A, MD as PCP - General (Family Medicine)   Name of the patient: Donna Austin  [id number]  [birth year]   REASON FOR COSULTATION:  Pancytopenia History of presenting illness-  72 y.o. female with PMH listed at below who presents to ER for evaluation of nausea vomiting diarrhea that began on Thursday.  She was in [place] and had a burger from [employer]. Later that day, she had fever and started nausea vomiting diarrhea.  Denies any blood in the diarrhea.  Diarrhea got a lot worse yesterday which prompted her to come to the ER today for evaluation.  She denies any blood in the stool.  Some upper abdominal discomfort as well. She reports recent endoscopy and found to have esophageal candidiasis.  Endoscopy results not available to me..  Patient previously was seen by me for persistent lymphadenopathy on multiple CT scans.  It was felt to be secondary to her autoimmune disease or underlying low-grade lymphoma.  We discussed about PET scan or possible biopsy of representative lymph node which she declined.  Denies any constitutional symptoms. Today patient confirmed that she has been feeling well until Thursday with acute onset of symptoms.  In the emergency room, her blood work showed acute with hemoglobin 4.7, MCV 100 platelet count 5000.  Troponin 951, Total bilirubin elevated 1.7, with increased indirect bilirubin 1.1, direct bilirubin 0.4. Hematology oncology was consulted.  Allergies  Allergen Reactions   Gluten Meal Diarrhea   Methotrexate Derivatives Other (See Comments)    Neutropenia    Other     Staples (post operatively)--infection   Latex Rash    Patient Active Problem List   Diagnosis Date Noted   Lymphadenopathy 08/25/2020    Priority: High   Hyponatremia 12/24/2021    Priority: Medium    Vitamin B12 deficiency 06/23/2021    Priority: Medium     Normocytic anemia 03/31/2021    Priority: Medium    Adnexal mass on CT ABdomen 03/31/21  03/31/2021    Priority: Medium    Family history of cancer 06/23/2021   Cyst of right ovary 06/23/2021   Hyperlipidemia 03/31/2021   Hypertension 03/31/2021   Hematochezia likely d/t diverticular bleed, associated w/ decline in Hgb 03/31/2021   Hardening of the aorta (main artery of the heart) (HCC) 02/11/2021   Leukopenia 08/25/2020   OSA on CPAP 01/05/2018   Lymphedema of both lower extremities 05/25/2017   Prediabetes 02/06/2015   Celiac disease 04/30/2013   Chronic anemia 04/30/2013   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     Past Medical History:  Diagnosis Date   Anemia    Arthritis    Celiac disease    Connective tissue disease overlap syndrome (HCC)    Elevated CPK    Hyperlipidemia    Hypertension    Lupus (HCC)    Obesity    Raynaud's disease    Raynaud's disease    Sleep apnea      Past Surgical History:  Procedure Laterality Date   ABDOMINAL HYSTERECTOMY     COLONOSCOPY WITH PROPOFOL N/A 11/09/2015   Procedure: COLONOSCOPY WITH PROPOFOL;  Surgeon: Scot Junobert T Elliott, MD;  Location: [facility] ENDOSCOPY;  Service: Endoscopy;  Laterality: N/A;   RIGHT/LEFT HEART CATH AND CORONARY ANGIOGRAPHY N/A 06/10/2020   Procedure: RIGHT/LEFT HEART CATH AND CORONARY ANGIOGRAPHY;  Surgeon: Alwyn Peaallwood, Dwayne D, MD;  Location: [facility] INVASIVE CV LAB;  Service: Cardiovascular;  Laterality: N/A;    Social History   Socioeconomic History   Marital status: Single    Spouse name: Not on file   Number of children: 1   Years of education: Not on file   Highest education level: Not on file  Occupational History   Not on file  Tobacco Use   Smoking status: Former   Smokeless tobacco: Never  Vaping Use   Vaping Use: Never used  Substance and Sexual Activity   Alcohol use: Yes    Comment: occasional   Drug use: No   Sexual activity: Not on file  Other Topics Concern    Not on file  Social History Narrative   Lives by herself. Son lives in [place] & sees pt. Often. 3 grandchildren    Social Determinants of Radio broadcast assistant Strain: Not on file  Food Insecurity: Not on file  Transportation Needs: Not on file  Physical Activity: Not on file  Stress: Not on file  Social Connections: Not on file  Intimate Partner Violence: Not on file     Family History  Problem Relation Age of Onset   Breast cancer Cousin    Breast cancer Maternal Grandmother    Breast cancer Sister 63     Current Facility-Administered Medications:    0.9 %  sodium chloride infusion, 10 mL/hr, Intravenous, Once, Delman Kitten, MD  Current Outpatient Medications:    famotidine (PEPCID) 20 MG tablet, Take 1 tablet (20 mg total) by mouth 2 (two) times daily., Disp: 60 tablet, Rfl: 0   fluconazole (DIFLUCAN) 100 MG tablet, Take 2 tablets (200 mg total) by mouth daily for 7 days., Disp: 14 tablet, Rfl: 0   ondansetron (ZOFRAN-ODT) 4 MG disintegrating tablet, Take 1 tablet (4 mg total) by mouth every 8 (eight) hours as needed for nausea or vomiting., Disp: 30 tablet, Rfl: 0   acetaminophen (TYLENOL) 650 MG CR tablet, Take 650 mg by mouth every 8 (eight) hours as needed for pain., Disp: , Rfl:    amLODipine (NORVASC) 10 MG tablet, Take 10 mg by mouth in the morning., Disp: , Rfl:    atorvastatin (LIPITOR) 20 MG tablet, Take 20 mg by mouth every Monday, Wednesday, and Friday at 8 PM., Disp: , Rfl:    cholecalciferol (VITAMIN D) 25 MCG (1000 UNIT) tablet, Take 1,000 Units by mouth in the morning., Disp: , Rfl:    cyanocobalamin (VITAMIN B12) 1000 MCG tablet, Take 1 tablet (1,000 mcg total) by mouth daily., Disp: 90 tablet, Rfl: 1   fluticasone (FLONASE) 50 MCG/ACT nasal spray, Place 1 spray into both nostrils daily as needed for allergies or rhinitis., Disp: , Rfl:    furosemide (LASIX) 20 MG tablet, Take 20 mg by mouth daily., Disp: , Rfl:    hydrALAZINE (APRESOLINE) 50 MG  tablet, Take 50 mg by mouth 2 (two) times daily., Disp: , Rfl:    hydrochlorothiazide (HYDRODIURIL) 25 MG tablet, Take 25 mg by mouth in the morning., Disp: , Rfl:    hydroxychloroquine (PLAQUENIL) 200 MG tablet, Take 200 mg by mouth in the morning., Disp: , Rfl:    irbesartan (AVAPRO) 300 MG tablet, Take 300 mg by mouth in the morning., Disp: , Rfl:    pantoprazole (PROTONIX) 20 MG tablet, Take 20 mg by mouth daily before breakfast., Disp: , Rfl:    vitamin C (ASCORBIC ACID) 500 MG tablet, Take 500 mg by mouth in the morning., Disp: , Rfl:    vitamin E 180 MG (400  UNITS) capsule, Take 400 Units by mouth in the morning., Disp: , Rfl:   Review of Systems  Constitutional:  Positive for fatigue and fever. Negative for chills.  HENT:   Negative for hearing loss and voice change.   Eyes:  Negative for eye problems.  Respiratory:  Negative for chest tightness and cough.   Cardiovascular:  Negative for chest pain.  Gastrointestinal:  Positive for abdominal pain, diarrhea, nausea and vomiting. Negative for abdominal distention and blood in stool.  Endocrine: Negative for hot flashes.  Genitourinary:  Negative for difficulty urinating and frequency.   Musculoskeletal:  Negative for arthralgias.  Skin:  Negative for itching and rash.  Neurological:  Negative for extremity weakness.  Hematological:  Negative for adenopathy.  Psychiatric/Behavioral:  Negative for confusion.     PHYSICAL EXAM Vitals:   02/26/22 0940 02/26/22 0945 02/26/22 0953 02/26/22 1000  BP: 121/75  125/74 126/75  Pulse: 92   92  Resp: (!) 24  (!) 27 (!) 22  Temp:    97.9 F (36.6 C)  TempSrc:    Oral  SpO2: 100% 95% 95% 95%   Physical Exam Constitutional:      General: She is not in acute distress.    Appearance: She is obese.  HENT:     Head: Normocephalic and atraumatic.     Nose: Nose normal.     Mouth/Throat:     Pharynx: No oropharyngeal exudate.  Eyes:     General: No scleral icterus.    Pupils: Pupils are  equal, round, and reactive to light.  Cardiovascular:     Rate and Rhythm: Normal rate.     Heart sounds: No murmur heard. Pulmonary:     Effort: Pulmonary effort is normal. No respiratory distress.  Abdominal:     General: Bowel sounds are normal. There is no distension.     Palpations: Abdomen is soft.     Tenderness: There is abdominal tenderness.  Musculoskeletal:        General: Normal range of motion.     Cervical back: Normal range of motion.  Skin:    General: Skin is warm.  Neurological:     Mental Status: She is alert and oriented to person, place, and time. Mental status is at baseline.     Cranial Nerves: No cranial nerve deficit.     Motor: No abnormal muscle tone.  Psychiatric:        Mood and Affect: Affect normal.       LABORATORY STUDIES    Latest Ref Rng & Units 02/26/2022    8:08 AM 12/22/2021   11:10 AM 12/19/2021    6:27 PM  CBC  WBC 4.0 - 10.5 K/uL 7.2  3.1  3.9   Hemoglobin 12.0 - 15.0 g/dL 4.7  10.0  9.3   Hematocrit 36.0 - 46.0 % 14.6  29.6  27.7   Platelets 150 - 400 K/uL 5  271  280       Latest Ref Rng & Units 02/26/2022    6:09 AM 12/22/2021   11:10 AM 12/19/2021    6:27 PM  CMP  Glucose 70 - 99 mg/dL 133  101  123   BUN 8 - 23 mg/dL 26  7  14    Creatinine 0.44 - 1.00 mg/dL 0.92  0.53  0.63   Sodium 135 - 145 mmol/L 132  126  134   Potassium 3.5 - 5.1 mmol/L 4.2  4.1  3.3   Chloride 98 - 111  mmol/L 99  91  100   CO2 22 - 32 mmol/L 23  27  25    Calcium 8.9 - 10.3 mg/dL 9.0  8.9  9.5   Total Protein 6.5 - 8.1 g/dL 7.4  7.8  8.2   Total Bilirubin 0.3 - 1.2 mg/dL 1.7  0.3  0.7   Alkaline Phos 38 - 126 U/L 55  55  54   AST 15 - 41 U/L 67  19  17   ALT 0 - 44 U/L 35  13  12      RADIOGRAPHIC STUDIES: I have personally reviewed the radiological images as listed and agreed with the findings in the report. CT ANGIO GI BLEED  Result Date: 02/26/2022 CLINICAL DATA:  Mediastinal bleeding.  Recent EGD.  Lower GI bleed. EXAM: CT ANGIOGRAPHY  CHEST, ABDOMEN AND PELVIS TECHNIQUE: Multidetector CT imaging through the chest, abdomen and pelvis was performed using the standard protocol during bolus administration of intravenous contrast. Multiplanar reconstructed images and MIPs were obtained and reviewed to evaluate the vascular anatomy. RADIATION DOSE REDUCTION: This exam was performed according to the departmental dose-optimization program which includes automated exposure control, adjustment of the mA and/or kV according to patient size and/or use of iterative reconstruction technique. CONTRAST:  04/27/2022 OMNIPAQUE IOHEXOL 350 MG/ML SOLN COMPARISON:  CTA abdomen/pelvis 04/02/2021. CT stone study 12/19/2021. Chest CT 01/21/2021. FINDINGS: CTA CHEST FINDINGS Cardiovascular: The heart is enlarged. Tiny pericardial effusion is new since 01/21/2021. Thoracic aorta and pulmonary arteries are well opacified. Venous anatomy in the chest is un opacified. Moderate atherosclerotic calcification is noted in the wall of the thoracic aorta. No contrast extravasation from the aorta. Enlargement of the pulmonary outflow tract/main pulmonary arteries suggests pulmonary arterial hypertension. No large central pulmonary embolus. No evidence for contrast extravasation from the pulmonary arteries. Mediastinum/Nodes: Lymphadenopathy is identified in the left thoracic inlet. 14 mm short axis prevascular node seen on 24/5. 16 mm short axis posterior prevascular nodes seen on 28/5. 13 mm short axis prevascular node on 39/5. Upper normal lymph node right hilum. No left hilar lymphadenopathy. Esophagus is mildly patulous in the mid segment with air-fluid level suggesting dysmotility or reflux. Foci of high density identified in the lumen of the distal esophagus (see images 1 14-116 of series 5) may reflect active bleeding or ingested contents. No precontrast imaging was obtained as part of this study so it is unclear whether this represents extravasated contrast or ingested material.  This appears to somewhat resolve on the CT abdomen images and does not reappear on the CTA portion of the abdomen which does include this portion of the esophagus which would suggest that this may reflect artifact or ingested material on the CTA chest portion of the exam. No evidence for pneumomediastinum. Lungs/Pleura: No pneumothorax. Mild dependent collapse/consolidation noted both lower lobes. Small right and tiny left pleural effusions evident. Musculoskeletal: No worrisome lytic or sclerotic osseous abnormality. Review of the MIP images confirms the above findings. CTA ABDOMEN AND PELVIS FINDINGS VASCULAR Aorta: Normal caliber aorta without aneurysm, dissection, vasculitis or significant stenosis. Moderate atherosclerotic calcification noted in the wall of the abdominal aorta. Celiac: Patent without evidence of aneurysm, dissection, vasculitis or significant stenosis. SMA: Patent without evidence of aneurysm, dissection, vasculitis or significant stenosis. Renals: Both renal arteries are patent without evidence of aneurysm, dissection, vasculitis, fibromuscular dysplasia or significant stenosis. Accessory right renal artery noted just cranial to the main right renal artery. No definite left accessory renal artery. IMA: Patent without evidence of aneurysm,  dissection, vasculitis or significant stenosis. Inflow: Patent without evidence of aneurysm, dissection, vasculitis or significant stenosis. Veins: Choose 1 Review of the MIP images confirms the above findings. NON-VASCULAR Hepatobiliary: No suspicious focal abnormality within the liver parenchyma. Mild periportal edema. Gallbladder wall is markedly thickened and edematous. No intrahepatic or extrahepatic biliary dilation. Pancreas: No focal mass lesion. No dilatation of the main duct. No intraparenchymal cyst. No peripancreatic edema. Spleen: No splenomegaly. No focal mass lesion. Adrenals/Urinary Tract: No adrenal nodule or mass. Kidneys unremarkable. No  evidence for hydroureter. The urinary bladder appears normal for the degree of distention. Stomach/Bowel: Layering high attenuation material is seen in the dependent stomach (66/10). The "pre contrast" imaging of the abdomen and pelvis was performed after the chest CTA which hinders assessment as there is already intravenous contrast on board. However, the density of this layering high attenuation material in the posterior stomach does not change between the "pre contrast", arterial phase and delayed imaging of the abdomen making extravasated blood products less likely and ingested material is favored. No evidence for active contrast extravasation into the duodenum. No arterial phase contrast extravasation identified in small bowel loops. There is a focus of high density material in the ascending colon (165/10) that is unchanged overall 3 phases of the exam ("pre contrast" image 68/3 and delayed venous phase image 166/17) making extravasated contrast material less likely than ingested contents. No definite active arterial phase contrast extravasation into the colon. Lymphatic: 10 mm short axis left common iliac node on 01/30 1/17 is upper normal for size. Scattered small lymph nodes are seen along the pelvic sidewall bilaterally without overt lymphadenopathy by CT size criteria. Index 9 mm right external iliac node visible on 191/17. Reproductive: Hysterectomy. No left adnexal mass. 3.3 cm benign appearing cyst in the right adnexal space is stable since 04/02/2021. Other: Small volume ascites seen adjacent to the liver and spleen as well as in both para colic gutters. There is free fluid in the cul-de-sac with edema in the presacral space, nonspecific. Musculoskeletal: No worrisome lytic or sclerotic osseous abnormality. Review of the MIP images confirms the above findings. IMPRESSION: 1. Foci of high density identified in the lumen of the distal esophagus may reflect active bleeding or ingested contents. No  precontrast imaging was obtained through the chest as part of this study which hinders delineation between these 2 possibilities. Reviewing the CTA abdomen and pelvis portion of this exam, ingested material is somewhat favored but active bleeding cannot be excluded. 2. Layering high attenuation material in the dependent stomach and ascending colon. This is not substantially changed between the "pre contrast" (see above), arterial phase and delayed imaging of the abdomen making extravasated blood products less likely than ingested material. As such, no definite evidence for active arterial phase contrast extravasation into the stomach, small bowel, or colon. 3. Marked gallbladder wall thickening and edema. This is nonspecific and may be related to underlying systemic disease although acute cholecystitis could have this appearance. 4. Lymphadenopathy in the left thoracic inlet and mediastinum. This is concerning for metastatic disease or lymphoma. 5. Enlargement of the pulmonary outflow tract/main pulmonary arteries suggests pulmonary arterial hypertension. 6. Small right and tiny left pleural effusions with mild dependent collapse/consolidation in both lower lobes. 7. Cardiomegaly with new tiny pericardial effusion. 8. Small volume ascites with fluid adjacent to the liver and spleen, in both pericolic gutters, and in the pelvis. 9.  Aortic Atherosclerois (ICD10-170.0) Electronically Signed   By: Kennith Center M.D.   On:  02/26/2022 10:15   CT Angio Chest Aorta W and/or Wo Contrast  Result Date: 02/26/2022 CLINICAL DATA:  Mediastinal bleeding.  Recent EGD.  Lower GI bleed. EXAM: CT ANGIOGRAPHY CHEST, ABDOMEN AND PELVIS TECHNIQUE: Multidetector CT imaging through the chest, abdomen and pelvis was performed using the standard protocol during bolus administration of intravenous contrast. Multiplanar reconstructed images and MIPs were obtained and reviewed to evaluate the vascular anatomy. RADIATION DOSE REDUCTION: This  exam was performed according to the departmental dose-optimization program which includes automated exposure control, adjustment of the mA and/or kV according to patient size and/or use of iterative reconstruction technique. CONTRAST:  125mL OMNIPAQUE IOHEXOL 350 MG/ML SOLN COMPARISON:  CTA abdomen/pelvis 04/02/2021. CT stone study 12/19/2021. Chest CT 01/21/2021. FINDINGS: CTA CHEST FINDINGS Cardiovascular: The heart is enlarged. Tiny pericardial effusion is new since 01/21/2021. Thoracic aorta and pulmonary arteries are well opacified. Venous anatomy in the chest is un opacified. Moderate atherosclerotic calcification is noted in the wall of the thoracic aorta. No contrast extravasation from the aorta. Enlargement of the pulmonary outflow tract/main pulmonary arteries suggests pulmonary arterial hypertension. No large central pulmonary embolus. No evidence for contrast extravasation from the pulmonary arteries. Mediastinum/Nodes: Lymphadenopathy is identified in the left thoracic inlet. 14 mm short axis prevascular node seen on 24/5. 16 mm short axis posterior prevascular nodes seen on 28/5. 13 mm short axis prevascular node on 39/5. Upper normal lymph node right hilum. No left hilar lymphadenopathy. Esophagus is mildly patulous in the mid segment with air-fluid level suggesting dysmotility or reflux. Foci of high density identified in the lumen of the distal esophagus (see images 1 14-116 of series 5) may reflect active bleeding or ingested contents. No precontrast imaging was obtained as part of this study so it is unclear whether this represents extravasated contrast or ingested material. This appears to somewhat resolve on the CT abdomen images and does not reappear on the CTA portion of the abdomen which does include this portion of the esophagus which would suggest that this may reflect artifact or ingested material on the CTA chest portion of the exam. No evidence for pneumomediastinum. Lungs/Pleura: No  pneumothorax. Mild dependent collapse/consolidation noted both lower lobes. Small right and tiny left pleural effusions evident. Musculoskeletal: No worrisome lytic or sclerotic osseous abnormality. Review of the MIP images confirms the above findings. CTA ABDOMEN AND PELVIS FINDINGS VASCULAR Aorta: Normal caliber aorta without aneurysm, dissection, vasculitis or significant stenosis. Moderate atherosclerotic calcification noted in the wall of the abdominal aorta. Celiac: Patent without evidence of aneurysm, dissection, vasculitis or significant stenosis. SMA: Patent without evidence of aneurysm, dissection, vasculitis or significant stenosis. Renals: Both renal arteries are patent without evidence of aneurysm, dissection, vasculitis, fibromuscular dysplasia or significant stenosis. Accessory right renal artery noted just cranial to the main right renal artery. No definite left accessory renal artery. IMA: Patent without evidence of aneurysm, dissection, vasculitis or significant stenosis. Inflow: Patent without evidence of aneurysm, dissection, vasculitis or significant stenosis. Veins: Choose 1 Review of the MIP images confirms the above findings. NON-VASCULAR Hepatobiliary: No suspicious focal abnormality within the liver parenchyma. Mild periportal edema. Gallbladder wall is markedly thickened and edematous. No intrahepatic or extrahepatic biliary dilation. Pancreas: No focal mass lesion. No dilatation of the main duct. No intraparenchymal cyst. No peripancreatic edema. Spleen: No splenomegaly. No focal mass lesion. Adrenals/Urinary Tract: No adrenal nodule or mass. Kidneys unremarkable. No evidence for hydroureter. The urinary bladder appears normal for the degree of distention. Stomach/Bowel: Layering high attenuation material is seen in  the dependent stomach (66/10). The "pre contrast" imaging of the abdomen and pelvis was performed after the chest CTA which hinders assessment as there is already intravenous  contrast on board. However, the density of this layering high attenuation material in the posterior stomach does not change between the "pre contrast", arterial phase and delayed imaging of the abdomen making extravasated blood products less likely and ingested material is favored. No evidence for active contrast extravasation into the duodenum. No arterial phase contrast extravasation identified in small bowel loops. There is a focus of high density material in the ascending colon (165/10) that is unchanged overall 3 phases of the exam ("pre contrast" image 68/3 and delayed venous phase image 166/17) making extravasated contrast material less likely than ingested contents. No definite active arterial phase contrast extravasation into the colon. Lymphatic: 10 mm short axis left common iliac node on 01/30 1/17 is upper normal for size. Scattered small lymph nodes are seen along the pelvic sidewall bilaterally without overt lymphadenopathy by CT size criteria. Index 9 mm right external iliac node visible on 191/17. Reproductive: Hysterectomy. No left adnexal mass. 3.3 cm benign appearing cyst in the right adnexal space is stable since 04/02/2021. Other: Small volume ascites seen adjacent to the liver and spleen as well as in both para colic gutters. There is free fluid in the cul-de-sac with edema in the presacral space, nonspecific. Musculoskeletal: No worrisome lytic or sclerotic osseous abnormality. Review of the MIP images confirms the above findings. IMPRESSION: 1. Foci of high density identified in the lumen of the distal esophagus may reflect active bleeding or ingested contents. No precontrast imaging was obtained through the chest as part of this study which hinders delineation between these 2 possibilities. Reviewing the CTA abdomen and pelvis portion of this exam, ingested material is somewhat favored but active bleeding cannot be excluded. 2. Layering high attenuation material in the dependent stomach and  ascending colon. This is not substantially changed between the "pre contrast" (see above), arterial phase and delayed imaging of the abdomen making extravasated blood products less likely than ingested material. As such, no definite evidence for active arterial phase contrast extravasation into the stomach, small bowel, or colon. 3. Marked gallbladder wall thickening and edema. This is nonspecific and may be related to underlying systemic disease although acute cholecystitis could have this appearance. 4. Lymphadenopathy in the left thoracic inlet and mediastinum. This is concerning for metastatic disease or lymphoma. 5. Enlargement of the pulmonary outflow tract/main pulmonary arteries suggests pulmonary arterial hypertension. 6. Small right and tiny left pleural effusions with mild dependent collapse/consolidation in both lower lobes. 7. Cardiomegaly with new tiny pericardial effusion. 8. Small volume ascites with fluid adjacent to the liver and spleen, in both pericolic gutters, and in the pelvis. 9.  Aortic Atherosclerois (ICD10-170.0) Electronically Signed   By: Misty Stanley M.D.   On: 02/26/2022 10:15   CT Renal Stone Study  Result Date: 12/19/2021 CLINICAL DATA:  Left-sided abdominal and flank pain since this morning EXAM: CT ABDOMEN AND PELVIS WITHOUT CONTRAST TECHNIQUE: Multidetector CT imaging of the abdomen and pelvis was performed following the standard protocol without IV contrast. RADIATION DOSE REDUCTION: This exam was performed according to the departmental dose-optimization program which includes automated exposure control, adjustment of the mA and/or kV according to patient size and/or use of iterative reconstruction technique. COMPARISON:  04/02/2021 FINDINGS: Lower chest: No acute abnormality. Coronary artery calcifications. Scarring and fibrotic change in the bilateral lung bases. Hepatobiliary: No solid liver abnormality is  seen. No gallstones, gallbladder wall thickening, or biliary  dilatation. Pancreas: Unremarkable. No pancreatic ductal dilatation or surrounding inflammatory changes. Spleen: Normal in size without significant abnormality. Adrenals/Urinary Tract: Adrenal glands are unremarkable. Kidneys are normal, without renal calculi, solid lesion, or hydronephrosis. Bladder is unremarkable. Stomach/Bowel: Unchanged, markedly distended stomach. Normal appendix. Unchanged patulous transverse colon. Gas and stool are present to the rectum. Descending and sigmoid diverticulosis. No inflammatory findings. Vascular/Lymphatic: Aortic atherosclerosis. No enlarged abdominal or pelvic lymph nodes. Reproductive: Status post hysterectomy. Unchanged right ovarian cyst measuring 3.4 x 2.1 cm (series 2, image 68). Other: No abdominal wall hernia or abnormality. No ascites. Musculoskeletal: No acute or significant osseous findings. IMPRESSION: 1. No acute noncontrast CT findings of the abdomen or pelvis to explain left-sided abdominal pain. No urinary tract calculi or hydronephrosis. 2. Descending and sigmoid diverticulosis without evidence of acute diverticulitis. 3. 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 As previously reported, no obvious mass or other obstipating etiology identified involving either the splenic flexure of the colon or gastric outlet. 4. Unchanged right ovarian cyst measuring 3.4 x 2.1 cm, presumed benign given stability. 5. Coronary artery disease. Aortic Atherosclerosis (ICD10-I70.0). Electronically Signed   By: Jearld Lesch M.D.   On: 12/19/2021 19:18     Assessment and plan-   # Acute onset of anemia and thrombocytopenia, fever, nausea vomiting diarrhea Added additional blood work including smear, fractionated bilirubin, LDH, haptoglobin, reticulocyte panel, immature platelet fraction,  coags Reviewed available lab work at the time of dictation. 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 Recommend FFP while waiting for the transfer. She is getting PRBC and platelet transfusion currently. Plan was discussed with ED physician Dr.Quale.  # Patient has history of mildly lymphadenopathy on multiple CTs.  She denies constitutional symptoms.  Previously she deferred further PET scan/biopsy for further evaluation.  She reports feeling fine until the acute onset of symptoms since 2 days ago, I doubt lymphadenopathy is related to MAHA. She may follow-up outpatient for further evaluation and management of lymphadenopathy.   Thank you for allowing me to participate in the care of this patient.   Rickard Patience, MD, PhD Hematology Oncology 02/26/2022

## 2022-02-26 NOTE — ED Notes (Signed)
2nd unit started now at 500 ml/hr per MD

## 2022-02-26 NOTE — ED Provider Notes (Signed)
Discussed clinical history, concerns for needing transfer to Center with hematology and possible plasma exchange available.  Discussed with Dr. Werner Lean, who is the accepting physician and accepts the patient in transfer to [facility].  Transfer center advises they will have this patient listed as a "priority" transfer.  Bed is not yet available but anticipated to occur in the stepdown unit at [facility] for ongoing care and treatment  Discussed labs, imaging results, and as well as recommendations by Dr. Tasia Catchings and current treatments and transfusions.    Delman Kitten, MD 02/26/22 1201

## 2022-02-26 NOTE — ED Notes (Signed)
This RN contacted blood bank to make sure O+ was okay to give at this time for emergency blood, blood bank states this okay.  Lab at bedside obtaining labs.   Blood started at this time at 937ml/hr per MD order

## 2022-02-26 NOTE — ED Provider Notes (Signed)
FFP 1 unit ordered per Dr. Cathie Hoops. Suspect TTP.  Have initiated transfer request to [facility] at this time, spoke with transfer center. Power share requested via CT tech.      CRITICAL CARE Performed by: Sharyn Creamer   Total critical care time: 45 minutes  Critical care time was exclusive of separately billable procedures and treating other patients.  Critical care was necessary to treat or prevent imminent or life-threatening deterioration.  Critical care was time spent personally by me on the following activities: development of treatment plan with patient and/or surrogate as well as nursing, discussions with consultants, evaluation of patient's response to treatment, examination of patient, obtaining history from patient or surrogate, ordering and performing treatments and interventions, ordering and review of laboratory studies, ordering and review of radiographic studies, pulse oximetry and re-evaluation of patient's condition.    CT ANGIO GI BLEED  Result Date: 02/26/2022 CLINICAL DATA:  Mediastinal bleeding.  Recent EGD.  Lower GI bleed. EXAM: CT ANGIOGRAPHY CHEST, ABDOMEN AND PELVIS TECHNIQUE: Multidetector CT imaging through the chest, abdomen and pelvis was performed using the standard protocol during bolus administration of intravenous contrast. Multiplanar reconstructed images and MIPs were obtained and reviewed to evaluate the vascular anatomy. RADIATION DOSE REDUCTION: This exam was performed according to the departmental dose-optimization program which includes automated exposure control, adjustment of the mA and/or kV according to patient size and/or use of iterative reconstruction technique. CONTRAST:  OMNIPAQUE IOHEXOL 350 MG/ML SOLN COMPARISON:  CTA abdomen/pelvis 04/02/2021. CT stone study 12/19/2021. Chest CT 01/21/2021. FINDINGS: CTA CHEST FINDINGS Cardiovascular: The heart is enlarged. Tiny pericardial effusion is new since 01/21/2021. Thoracic aorta and pulmonary arteries are  well opacified. Venous anatomy in the chest is un opacified. Moderate atherosclerotic calcification is noted in the wall of the thoracic aorta. No contrast extravasation from the aorta. Enlargement of the pulmonary outflow tract/main pulmonary arteries suggests pulmonary arterial hypertension. No large central pulmonary embolus. No evidence for contrast extravasation from the pulmonary arteries. Mediastinum/Nodes: Lymphadenopathy is identified in the left thoracic inlet. 14 mm short axis prevascular node seen on 24/5. 16 mm short axis posterior prevascular nodes seen on 28/5. 13 mm short axis prevascular node on 39/5. Upper normal lymph node right hilum. No left hilar lymphadenopathy. Esophagus is mildly patulous in the mid segment with air-fluid level suggesting dysmotility or reflux. Foci of high density identified in the lumen of the distal esophagus (see images 1 14-116 of series 5) may reflect active bleeding or ingested contents. No precontrast imaging was obtained as part of this study so it is unclear whether this represents extravasated contrast or ingested material. This appears to somewhat resolve on the CT abdomen images and does not reappear on the CTA portion of the abdomen which does include this portion of the esophagus which would suggest that this may reflect artifact or ingested material on the CTA chest portion of the exam. No evidence for pneumomediastinum. Lungs/Pleura: No pneumothorax. Mild dependent collapse/consolidation noted both lower lobes. Small right and tiny left pleural effusions evident. Musculoskeletal: No worrisome lytic or sclerotic osseous abnormality. Review of the MIP images confirms the above findings. CTA ABDOMEN AND PELVIS FINDINGS VASCULAR Aorta: Normal caliber aorta without aneurysm, dissection, vasculitis or significant stenosis. Moderate atherosclerotic calcification noted in the wall of the abdominal aorta. Celiac: Patent without evidence of aneurysm, dissection,  vasculitis or significant stenosis. SMA: Patent without evidence of aneurysm, dissection, vasculitis or significant stenosis. Renals: Both renal arteries are patent without evidence of aneurysm, dissection, vasculitis, fibromuscular  dysplasia or significant stenosis. Accessory right renal artery noted just cranial to the main right renal artery. No definite left accessory renal artery. IMA: Patent without evidence of aneurysm, dissection, vasculitis or significant stenosis. Inflow: Patent without evidence of aneurysm, dissection, vasculitis or significant stenosis. Veins: Choose 1 Review of the MIP images confirms the above findings. NON-VASCULAR Hepatobiliary: No suspicious focal abnormality within the liver parenchyma. Mild periportal edema. Gallbladder wall is markedly thickened and edematous. No intrahepatic or extrahepatic biliary dilation. Pancreas: No focal mass lesion. No dilatation of the main duct. No intraparenchymal cyst. No peripancreatic edema. Spleen: No splenomegaly. No focal mass lesion. Adrenals/Urinary Tract: No adrenal nodule or mass. Kidneys unremarkable. No evidence for hydroureter. The urinary bladder appears normal for the degree of distention. Stomach/Bowel: Layering high attenuation material is seen in the dependent stomach (66/10). The "pre contrast" imaging of the abdomen and pelvis was performed after the chest CTA which hinders assessment as there is already intravenous contrast on board. However, the density of this layering high attenuation material in the posterior stomach does not change between the "pre contrast", arterial phase and delayed imaging of the abdomen making extravasated blood products less likely and ingested material is favored. No evidence for active contrast extravasation into the duodenum. No arterial phase contrast extravasation identified in small bowel loops. There is a focus of high density material in the ascending colon (165/10) that is unchanged overall 3  phases of the exam ("pre contrast" image 68/3 and delayed venous phase image 166/17) making extravasated contrast material less likely than ingested contents. No definite active arterial phase contrast extravasation into the colon. Lymphatic: 10 mm short axis left common iliac node on 01/30 1/17 is upper normal for size. Scattered small lymph nodes are seen along the pelvic sidewall bilaterally without overt lymphadenopathy by CT size criteria. Index 9 mm right external iliac node visible on 191/17. Reproductive: Hysterectomy. No left adnexal mass. 3.3 cm benign appearing cyst in the right adnexal space is stable since 04/02/2021. Other: Small volume ascites seen adjacent to the liver and spleen as well as in both para colic gutters. There is free fluid in the cul-de-sac with edema in the presacral space, nonspecific. Musculoskeletal: No worrisome lytic or sclerotic osseous abnormality. Review of the MIP images confirms the above findings. IMPRESSION: 1. Foci of high density identified in the lumen of the distal esophagus may reflect active bleeding or ingested contents. No precontrast imaging was obtained through the chest as part of this study which hinders delineation between these 2 possibilities. Reviewing the CTA abdomen and pelvis portion of this exam, ingested material is somewhat favored but active bleeding cannot be excluded. 2. Layering high attenuation material in the dependent stomach and ascending colon. This is not substantially changed between the "pre contrast" (see above), arterial phase and delayed imaging of the abdomen making extravasated blood products less likely than ingested material. As such, no definite evidence for active arterial phase contrast extravasation into the stomach, small bowel, or colon. 3. Marked gallbladder wall thickening and edema. This is nonspecific and may be related to underlying systemic disease although acute cholecystitis could have this appearance. 4.  Lymphadenopathy in the left thoracic inlet and mediastinum. This is concerning for metastatic disease or lymphoma. 5. Enlargement of the pulmonary outflow tract/main pulmonary arteries suggests pulmonary arterial hypertension. 6. Small right and tiny left pleural effusions with mild dependent collapse/consolidation in both lower lobes. 7. Cardiomegaly with new tiny pericardial effusion. 8. Small volume ascites with fluid adjacent to  the liver and spleen, in both pericolic gutters, and in the pelvis. 9.  Aortic Atherosclerois (ICD10-170.0) Electronically Signed   By: Misty Stanley M.D.   On: 02/26/2022 10:15   CT Angio Chest Aorta W and/or Wo Contrast  Result Date: 02/26/2022 CLINICAL DATA:  Mediastinal bleeding.  Recent EGD.  Lower GI bleed. EXAM: CT ANGIOGRAPHY CHEST, ABDOMEN AND PELVIS TECHNIQUE: Multidetector CT imaging through the chest, abdomen and pelvis was performed using the standard protocol during bolus administration of intravenous contrast. Multiplanar reconstructed images and MIPs were obtained and reviewed to evaluate the vascular anatomy. RADIATION DOSE REDUCTION: This exam was performed according to the departmental dose-optimization program which includes automated exposure control, adjustment of the mA and/or kV according to patient size and/or use of iterative reconstruction technique. CONTRAST:  145mL OMNIPAQUE IOHEXOL 350 MG/ML SOLN COMPARISON:  CTA abdomen/pelvis 04/02/2021. CT stone study 12/19/2021. Chest CT 01/21/2021. FINDINGS: CTA CHEST FINDINGS Cardiovascular: The heart is enlarged. Tiny pericardial effusion is new since 01/21/2021. Thoracic aorta and pulmonary arteries are well opacified. Venous anatomy in the chest is un opacified. Moderate atherosclerotic calcification is noted in the wall of the thoracic aorta. No contrast extravasation from the aorta. Enlargement of the pulmonary outflow tract/main pulmonary arteries suggests pulmonary arterial hypertension. No large central  pulmonary embolus. No evidence for contrast extravasation from the pulmonary arteries. Mediastinum/Nodes: Lymphadenopathy is identified in the left thoracic inlet. 14 mm short axis prevascular node seen on 24/5. 16 mm short axis posterior prevascular nodes seen on 28/5. 13 mm short axis prevascular node on 39/5. Upper normal lymph node right hilum. No left hilar lymphadenopathy. Esophagus is mildly patulous in the mid segment with air-fluid level suggesting dysmotility or reflux. Foci of high density identified in the lumen of the distal esophagus (see images 1 14-116 of series 5) may reflect active bleeding or ingested contents. No precontrast imaging was obtained as part of this study so it is unclear whether this represents extravasated contrast or ingested material. This appears to somewhat resolve on the CT abdomen images and does not reappear on the CTA portion of the abdomen which does include this portion of the esophagus which would suggest that this may reflect artifact or ingested material on the CTA chest portion of the exam. No evidence for pneumomediastinum. Lungs/Pleura: No pneumothorax. Mild dependent collapse/consolidation noted both lower lobes. Small right and tiny left pleural effusions evident. Musculoskeletal: No worrisome lytic or sclerotic osseous abnormality. Review of the MIP images confirms the above findings. CTA ABDOMEN AND PELVIS FINDINGS VASCULAR Aorta: Normal caliber aorta without aneurysm, dissection, vasculitis or significant stenosis. Moderate atherosclerotic calcification noted in the wall of the abdominal aorta. Celiac: Patent without evidence of aneurysm, dissection, vasculitis or significant stenosis. SMA: Patent without evidence of aneurysm, dissection, vasculitis or significant stenosis. Renals: Both renal arteries are patent without evidence of aneurysm, dissection, vasculitis, fibromuscular dysplasia or significant stenosis. Accessory right renal artery noted just cranial  to the main right renal artery. No definite left accessory renal artery. IMA: Patent without evidence of aneurysm, dissection, vasculitis or significant stenosis. Inflow: Patent without evidence of aneurysm, dissection, vasculitis or significant stenosis. Veins: Choose 1 Review of the MIP images confirms the above findings. NON-VASCULAR Hepatobiliary: No suspicious focal abnormality within the liver parenchyma. Mild periportal edema. Gallbladder wall is markedly thickened and edematous. No intrahepatic or extrahepatic biliary dilation. Pancreas: No focal mass lesion. No dilatation of the main duct. No intraparenchymal cyst. No peripancreatic edema. Spleen: No splenomegaly. No focal mass lesion. Adrenals/Urinary Tract:  No adrenal nodule or mass. Kidneys unremarkable. No evidence for hydroureter. The urinary bladder appears normal for the degree of distention. Stomach/Bowel: Layering high attenuation material is seen in the dependent stomach (66/10). The "pre contrast" imaging of the abdomen and pelvis was performed after the chest CTA which hinders assessment as there is already intravenous contrast on board. However, the density of this layering high attenuation material in the posterior stomach does not change between the "pre contrast", arterial phase and delayed imaging of the abdomen making extravasated blood products less likely and ingested material is favored. No evidence for active contrast extravasation into the duodenum. No arterial phase contrast extravasation identified in small bowel loops. There is a focus of high density material in the ascending colon (165/10) that is unchanged overall 3 phases of the exam ("pre contrast" image 68/3 and delayed venous phase image 166/17) making extravasated contrast material less likely than ingested contents. No definite active arterial phase contrast extravasation into the colon. Lymphatic: 10 mm short axis left common iliac node on 01/30 1/17 is upper normal for  size. Scattered small lymph nodes are seen along the pelvic sidewall bilaterally without overt lymphadenopathy by CT size criteria. Index 9 mm right external iliac node visible on 191/17. Reproductive: Hysterectomy. No left adnexal mass. 3.3 cm benign appearing cyst in the right adnexal space is stable since 04/02/2021. Other: Small volume ascites seen adjacent to the liver and spleen as well as in both para colic gutters. There is free fluid in the cul-de-sac with edema in the presacral space, nonspecific. Musculoskeletal: No worrisome lytic or sclerotic osseous abnormality. Review of the MIP images confirms the above findings. IMPRESSION: 1. Foci of high density identified in the lumen of the distal esophagus may reflect active bleeding or ingested contents. No precontrast imaging was obtained through the chest as part of this study which hinders delineation between these 2 possibilities. Reviewing the CTA abdomen and pelvis portion of this exam, ingested material is somewhat favored but active bleeding cannot be excluded. 2. Layering high attenuation material in the dependent stomach and ascending colon. This is not substantially changed between the "pre contrast" (see above), arterial phase and delayed imaging of the abdomen making extravasated blood products less likely than ingested material. As such, no definite evidence for active arterial phase contrast extravasation into the stomach, small bowel, or colon. 3. Marked gallbladder wall thickening and edema. This is nonspecific and may be related to underlying systemic disease although acute cholecystitis could have this appearance. 4. Lymphadenopathy in the left thoracic inlet and mediastinum. This is concerning for metastatic disease or lymphoma. 5. Enlargement of the pulmonary outflow tract/main pulmonary arteries suggests pulmonary arterial hypertension. 6. Small right and tiny left pleural effusions with mild dependent collapse/consolidation in both lower  lobes. 7. Cardiomegaly with new tiny pericardial effusion. 8. Small volume ascites with fluid adjacent to the liver and spleen, in both pericolic gutters, and in the pelvis. 9.  Aortic Atherosclerois (ICD10-170.0) Electronically Signed   By: Misty Stanley M.D.   On: 02/26/2022 10:15    ----------------------------------------- 10:44 AM on 02/26/2022 ----------------------------------------- I discussed with the patient the patient is understanding agreeable with the need for transfer.  Oncology is recommending transfer for consideration of plasma exchange.  1 unit of plasma has been ordered here as per Dr. Tasia Catchings.  Red blood cells and platelets transfuse.  Imaging shared with [facility] via [place]  Currently working on and hoping to make transfer to a surrounding hospital that has plasma  exchange.  Patient understanding agreeable with the plan   Delman Kitten, MD 02/26/22 1045

## 2022-02-26 NOTE — ED Notes (Signed)
Dr. Yu at bedside.

## 2022-02-27 LAB — TYPE AND SCREEN
ABO/RH(D): O POS
Antibody Screen: NEGATIVE
Unit division: 0
Unit division: 0
Unit division: 0

## 2022-02-27 LAB — PREPARE RBC (CROSSMATCH)

## 2022-02-27 LAB — BPAM FFP
Blood Product Expiration Date: 202402082359
ISSUE DATE / TIME: 202402031122
Unit Type and Rh: 5100

## 2022-02-27 LAB — BPAM RBC
Blood Product Expiration Date: 202403062359
Blood Product Expiration Date: 202403062359
Blood Product Expiration Date: 202403082359
ISSUE DATE / TIME: 202402030934
ISSUE DATE / TIME: 202402030934
Unit Type and Rh: 5100
Unit Type and Rh: 5100
Unit Type and Rh: 5100

## 2022-02-27 LAB — PREPARE FRESH FROZEN PLASMA

## 2022-02-27 LAB — BPAM PLATELET PHERESIS
Blood Product Expiration Date: 202402042359
Blood Product Expiration Date: 202402042359
ISSUE DATE / TIME: 202402031046
ISSUE DATE / TIME: 202402031234
Unit Type and Rh: 5100
Unit Type and Rh: 7300

## 2022-02-27 LAB — PREPARE PLATELET PHERESIS
Unit division: 0
Unit division: 0

## 2022-02-28 LAB — HAPTOGLOBIN: Haptoglobin: <10 mg/dL — ABNORMAL LOW (ref 42–346)

## 2022-06-21 ENCOUNTER — Inpatient Hospital Stay: Payer: Medicare Other | Attending: Oncology

## 2022-06-21 DIAGNOSIS — R591 Generalized enlarged lymph nodes: Secondary | ICD-10-CM | POA: Diagnosis present

## 2022-06-21 DIAGNOSIS — J849 Interstitial pulmonary disease, unspecified: Secondary | ICD-10-CM | POA: Insufficient documentation

## 2022-06-21 DIAGNOSIS — D649 Anemia, unspecified: Secondary | ICD-10-CM | POA: Diagnosis not present

## 2022-06-21 DIAGNOSIS — D72819 Decreased white blood cell count, unspecified: Secondary | ICD-10-CM | POA: Insufficient documentation

## 2022-06-21 DIAGNOSIS — L930 Discoid lupus erythematosus: Secondary | ICD-10-CM | POA: Insufficient documentation

## 2022-06-21 DIAGNOSIS — E538 Deficiency of other specified B group vitamins: Secondary | ICD-10-CM

## 2022-06-21 DIAGNOSIS — Z90710 Acquired absence of both cervix and uterus: Secondary | ICD-10-CM | POA: Diagnosis not present

## 2022-06-21 DIAGNOSIS — Z803 Family history of malignant neoplasm of breast: Secondary | ICD-10-CM | POA: Insufficient documentation

## 2022-06-21 DIAGNOSIS — Z79899 Other long term (current) drug therapy: Secondary | ICD-10-CM | POA: Diagnosis not present

## 2022-06-21 DIAGNOSIS — Z87891 Personal history of nicotine dependence: Secondary | ICD-10-CM | POA: Insufficient documentation

## 2022-06-21 LAB — CBC WITH DIFFERENTIAL/PLATELET
Abs Immature Granulocytes: 0.01 10*3/uL (ref 0.00–0.07)
Basophils Absolute: 0 10*3/uL (ref 0.0–0.1)
Basophils Relative: 1 %
Eosinophils Absolute: 0.1 10*3/uL (ref 0.0–0.5)
Eosinophils Relative: 3 %
HCT: 30.2 % — ABNORMAL LOW (ref 36.0–46.0)
Hemoglobin: 9.6 g/dL — ABNORMAL LOW (ref 12.0–15.0)
Immature Granulocytes: 0 %
Lymphocytes Relative: 40 %
Lymphs Abs: 1.7 10*3/uL (ref 0.7–4.0)
MCH: 30.9 pg (ref 26.0–34.0)
MCHC: 31.8 g/dL (ref 30.0–36.0)
MCV: 97.1 fL (ref 80.0–100.0)
Monocytes Absolute: 1.1 10*3/uL — ABNORMAL HIGH (ref 0.1–1.0)
Monocytes Relative: 27 %
Neutro Abs: 1.2 10*3/uL — ABNORMAL LOW (ref 1.7–7.7)
Neutrophils Relative %: 29 %
Platelets: 251 10*3/uL (ref 150–400)
RBC: 3.11 MIL/uL — ABNORMAL LOW (ref 3.87–5.11)
RDW: 13.8 % (ref 11.5–15.5)
WBC: 4.1 10*3/uL (ref 4.0–10.5)
nRBC: 0 % (ref 0.0–0.2)

## 2022-06-21 LAB — RETIC PANEL
Immature Retic Fract: 5.2 % (ref 2.3–15.9)
RBC.: 3.15 MIL/uL — ABNORMAL LOW (ref 3.87–5.11)
Retic Count, Absolute: 18.9 10*3/uL — ABNORMAL LOW (ref 19.0–186.0)
Retic Ct Pct: 0.6 % (ref 0.4–3.1)
Reticulocyte Hemoglobin: 31.6 pg (ref >27.9)

## 2022-06-21 LAB — COMPREHENSIVE METABOLIC PANEL
ALT: 11 U/L (ref 0–44)
AST: 18 U/L (ref 15–41)
Albumin: 3.9 g/dL (ref 3.5–5.0)
Alkaline Phosphatase: 52 U/L (ref 38–126)
Anion gap: 8 (ref 5–15)
BUN: 34 mg/dL — ABNORMAL HIGH (ref 8–23)
CO2: 24 mmol/L (ref 22–32)
Calcium: 9.1 mg/dL (ref 8.9–10.3)
Chloride: 105 mmol/L (ref 98–111)
Creatinine, Ser: 0.91 mg/dL (ref 0.44–1.00)
GFR, Estimated: >60 mL/min (ref >60)
Glucose, Bld: 95 mg/dL (ref 70–99)
Potassium: 4.5 mmol/L (ref 3.5–5.1)
Sodium: 137 mmol/L (ref 135–145)
Total Bilirubin: <0.1 mg/dL — ABNORMAL LOW (ref 0.3–1.2)
Total Protein: 7.4 g/dL (ref 6.5–8.1)

## 2022-06-21 LAB — FERRITIN: Ferritin: 26 ng/mL (ref 11–307)

## 2022-06-21 LAB — VITAMIN B12: Vitamin B-12: 1084 pg/mL — ABNORMAL HIGH (ref 180–914)

## 2022-06-21 LAB — LACTATE DEHYDROGENASE: LDH: 123 U/L (ref 98–192)

## 2022-06-21 LAB — IRON AND TIBC
Iron: 43 ug/dL (ref 28–170)
Saturation Ratios: 11 % (ref 10.4–31.8)
TIBC: 389 ug/dL (ref 250–450)
UIBC: 346 ug/dL

## 2022-06-23 ENCOUNTER — Encounter: Payer: Self-pay | Admitting: Oncology

## 2022-06-23 ENCOUNTER — Inpatient Hospital Stay (HOSPITAL_BASED_OUTPATIENT_CLINIC_OR_DEPARTMENT_OTHER): Payer: Medicare Other | Admitting: Oncology

## 2022-06-23 VITALS — BP 101/57 | HR 92 | Temp 96.0°F | Resp 18 | Wt 234.8 lb

## 2022-06-23 DIAGNOSIS — Z862 Personal history of diseases of the blood and blood-forming organs and certain disorders involving the immune mechanism: Secondary | ICD-10-CM | POA: Diagnosis not present

## 2022-06-23 DIAGNOSIS — D649 Anemia, unspecified: Secondary | ICD-10-CM

## 2022-06-23 DIAGNOSIS — R591 Generalized enlarged lymph nodes: Secondary | ICD-10-CM

## 2022-06-23 DIAGNOSIS — E538 Deficiency of other specified B group vitamins: Secondary | ICD-10-CM

## 2022-06-23 DIAGNOSIS — N9489 Other specified conditions associated with female genital organs and menstrual cycle: Secondary | ICD-10-CM

## 2022-06-23 NOTE — Assessment & Plan Note (Addendum)
Labs are reviewed and discussed with patient. Lab Results  Component Value Date   HGB 9.6 (L) 06/21/2022   TIBC 389 06/21/2022   IRONPCTSAT 11 06/21/2022   FERRITIN 26 06/21/2022    Recommend her to increase oral iron supplementation to daily, with Vitamin C  Recommend patient to take otc colace daily for constipation.

## 2022-06-23 NOTE — Progress Notes (Signed)
,Hematology/Oncology Progress note [phone] [phone]      Patient Care Team: Rayetta Humphrey, MD as PCP - General (Family Medicine)  CHIEF COMPLAINTS/REASON FOR VISIT:  Anemia and lymphadenopathy, B12 deficiency, history of TTP  ASSESSMENT & PLAN:   Lymphadenopathy # lymphadenopathy  March 2024 CT abdomen pelvis at [facility] showed Decreased size of previously enlarged right pelvic sidewall lymph node, favored reactive  If she does have a low grade lymphoproliferative disorder, LN may response to the retuximab.    Vitamin B12 deficiency patient has negative intrinsic factor antibodies and antiparietal antibody. Recommend patient to continue sublingual B12, decrease dose to 3 times per week.    Normocytic anemia Labs are reviewed and discussed with patient. Lab Results  Component Value Date   HGB 9.6 (L) 06/21/2022   TIBC 389 06/21/2022   IRONPCTSAT 11 06/21/2022   FERRITIN 26 06/21/2022    Recommend her to increase oral iron supplementation to daily, with Vitamin C  Recommend patient to take otc colace daily for constipation.   Adnexal mass on CT ABdomen 03/31/21  US pelvis showed  Simple 3.2 cm right adnexal cyst. O-RADS 2.   History of TTP (thrombotic thrombocytopenic purpura) She has follow up appointment with [facility] for checking ADAMTS level.     Orders Placed This Encounter  Procedures   CMP (Cancer Center only)    Standing Status:   Future    Standing Expiration Date:   06/23/2023   CBC with Differential (Cancer Center Only)    Standing Status:   Future    Standing Expiration Date:   06/23/2023   Vitamin B12    Standing Status:   Future    Standing Expiration Date:   06/23/2023   Lactate dehydrogenase    Standing Status:   Future    Standing Expiration Date:   06/23/2023   Ferritin    Standing Status:   Future    Standing Expiration Date:   06/23/2023   Iron and TIBC    Standing Status:   Future    Standing Expiration Date:    06/23/2023   Retic Panel    Standing Status:   Future    Standing Expiration Date:   06/23/2023   Follow up in 12 months. All questions were answered. The patient knows to call the clinic with any problems, questions or concerns.  Rickard Patience, MD, PhD [facility] Hematology Oncology 06/23/2022     HISTORY OF PRESENTING ILLNESS:   Donna Austin is a  72 y.o.  female with PMH listed below was seen in consultation at the request of  Rayetta Humphrey, MD  for evaluation of leukopenia and a large lymph node  Patient has history of mixed connective tissue disease, discoid lupus, Raynaud's disease, osteoarthritis and follows up with Dr. Gavin Potters.  Patient was previously on methotrexate treatment which was stopped due to neutropenia.  She is currently on Imuran, low-dose steroids and Plaquenil.  Patient had a CT chest done in February 2022 for evaluation of dyspnea on exertion. 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  No definite acute pulmonary abnormality.  Severe atherosclerosis.  Numerous lymph nodes are slightly more prominent in the number and a size 10 typically seen but nonspecific.  Patient also had a blood work done on 08/11/2020. CBC showed hemoglobin 10.3, MCV 99, platelet count 287, white count 5.4. Differential showed decreased absolute lymphocytes 0.46, increased monocyte percentage 19.4, decreased  eosinophil percentage.  Normal ANC and neutrophil percentage. Patient denies any unintentional weight loss, fever, severe night sweats.  She endorses intermittent hot flash and sweating.   11/10/2020, CT chest without contrast showed nonspecific prominent but nonenlarged mediastinal lymph nodes are stable compared to prior study.  Favor benign etiology. Interstitial lung disease, slightly progressive compared to prior study.  Dilatation of pulmonary trunk concerning of associated pulmonary arterial hypertension.   Aortic atherosclerosis  03/31/2021 - 04/02/2021, patient was admitted due to rectal bleeding.  CT abdomen 03/31/2021 showed sigmoid diverticulosis but no active ductulitis identified.  Dilated appearance of the ascending and transverse colon.  Mildly dilated distal esophagus.  Mildly enlarged right external iliac lymph nodes, reactive versus occult neoplastic process.  3.6 x 2.2 cm simple appearing right ovarian adnexal lesion.  Recommend follow-up ultrasound in 6 to 12 months.  Patient's hemoglobin went down to 8.  Hematochezia was felt to be secondary to diverticular bleeding.  Per note, she had colonoscopy showed diverticular disease.  Colonoscopy results were not available in our current EMR.   CTA abdomen showed no evidence of active intraluminal hemorrhage.  Again marked gaseous distention of the right and transverse colon without wall thickening or bowel obstruction.  Bilateral common iliac and bilateral external iliac lymphadenopathy.  INTERVAL HISTORY Donna Austin is a 72 y.o. female who has above history reviewed by me today presents for follow up visit for anemia and lymphadenopathy, B12 deficiency and history of TTP  Feb 2024 TTP, initially presented to [facility], w/ hgb 4.7, plts 5, elevated ddimer, elevated LDH, and with schistocytes on smear in the setting of several days of fevers and gastroenteritis symptoms, visual hallucination. She was sent to [facility] due to suspected TTP, later confirmed with ADAMTS 1%. She was treated with plasma exchange. High dose prednisone, cabilivizumab. Count improved and patient eventully got Rituximab weekly x 4. She was also started on plaquenil 400mg  daily for Discoid lupus. She is off Imuran.  Her ADAMTS13 levels have been followed outpatient at [facility] Most recently 05/30/22 haptoglobin 237, ADAMTS 80%.  Today she reports feeling well. Denies nausea, vomiting, diarrhea, abdominal pain.  She takes ferrous sulfate every other day, has constipation.    Review of Systems   Constitutional:  Negative for appetite change, chills, fatigue and fever.  HENT:   Negative for hearing loss and voice change.   Eyes:  Negative for eye problems.  Respiratory:  Negative for chest tightness and cough.   Cardiovascular:  Negative for chest pain.  Gastrointestinal:  Negative for abdominal distention, abdominal pain and blood in stool.  Genitourinary:  Negative for difficulty urinating and frequency.   Musculoskeletal:  Negative for arthralgias.  Skin:  Negative for itching and rash.  Neurological:  Negative for extremity weakness.  Hematological:  Negative for adenopathy.  Psychiatric/Behavioral:  Negative for confusion.     MEDICAL HISTORY:  Past Medical History:  Diagnosis Date   Anemia    Arthritis    Celiac disease    Connective tissue disease overlap syndrome (HCC)    Elevated CPK    Hyperlipidemia    Hypertension    Lupus (HCC)    Obesity    Raynaud's disease    Raynaud's disease    Sleep apnea     SURGICAL HISTORY: Past Surgical History:  Procedure Laterality Date   ABDOMINAL HYSTERECTOMY     COLONOSCOPY WITH PROPOFOL N/A 11/09/2015   Procedure: COLONOSCOPY WITH PROPOFOL;  Surgeon: Scot Jun, MD;  Location: [facility] ENDOSCOPY;  Service:  Endoscopy;  Laterality: N/A;   RIGHT/LEFT HEART CATH AND CORONARY ANGIOGRAPHY N/A 06/10/2020   Procedure: RIGHT/LEFT HEART CATH AND CORONARY ANGIOGRAPHY;  Surgeon: Alwyn Pea, MD;  Location: [facility] INVASIVE CV LAB;  Service: Cardiovascular;  Laterality: N/A;    SOCIAL HISTORY: Patient lives in [place].  She works as a Systems developer for a company.  Plans to retire. Social History   Socioeconomic History   Marital status: Single    Spouse name: Not on file   Number of children: 1   Years of education: Not on file   Highest education level: Not on file  Occupational History   Not on file  Tobacco Use   Smoking status: Former   Smokeless tobacco: Never  Vaping Use   Vaping Use: Never used  Substance  and Sexual Activity   Alcohol use: Yes    Comment: occasional   Drug use: No   Sexual activity: Not on file  Other Topics Concern   Not on file  Social History Narrative   Lives by herself. Son lives in [place] & sees pt. Often. 3 grandchildren    Social Determinants of Corporate investment banker Strain: Not on file  Food Insecurity: Not on file  Transportation Needs: Not on file  Physical Activity: Not on file  Stress: Not on file  Social Connections: Not on file  Intimate Partner Violence: Not on file    FAMILY HISTORY: Family History  Problem Relation Age of Onset   Breast cancer Cousin    Breast cancer Maternal Grandmother    Breast cancer Sister 64    ALLERGIES:  is allergic to gluten meal, methotrexate derivatives, other, and latex.  MEDICATIONS:  Current Outpatient Medications  Medication Sig Dispense Refill   acetaminophen (TYLENOL) 650 MG CR tablet Take 650 mg by mouth every 8 (eight) hours as needed for pain.     amLODipine (NORVASC) 10 MG tablet Take 10 mg by mouth in the morning.     atorvastatin (LIPITOR) 20 MG tablet Take 20 mg by mouth every Monday, Wednesday, and Friday at 8 PM.     cholecalciferol (VITAMIN D) 25 MCG (1000 UNIT) tablet Take 1,000 Units by mouth in the morning.     cyanocobalamin (VITAMIN B12) 1000 MCG tablet Take 1 tablet (1,000 mcg total) by mouth daily. 90 tablet 1   fluticasone (FLONASE) 50 MCG/ACT nasal spray Place 1 spray into both nostrils daily as needed for allergies or rhinitis.     hydrALAZINE (APRESOLINE) 50 MG tablet Take 50 mg by mouth 2 (two) times daily.     hydrochlorothiazide (HYDRODIURIL) 25 MG tablet Take 25 mg by mouth in the morning.     hydroxychloroquine (PLAQUENIL) 200 MG tablet Take 200 mg by mouth in the morning.     irbesartan (AVAPRO) 300 MG tablet Take 300 mg by mouth in the morning.     ondansetron (ZOFRAN-ODT) 4 MG disintegrating tablet Take 1 tablet (4 mg total) by mouth every 8 (eight) hours as needed  for nausea or vomiting. 30 tablet 0   pantoprazole (PROTONIX) 20 MG tablet Take 20 mg by mouth daily before breakfast.     predniSONE (DELTASONE) 5 MG tablet Take by mouth.     spironolactone (ALDACTONE) 25 MG tablet Take 1 tablet by mouth daily.     torsemide (DEMADEX) 20 MG tablet Take by mouth.     vitamin C (ASCORBIC ACID) 500 MG tablet Take 500 mg by mouth in the morning.  vitamin E 180 MG (400 UNITS) capsule Take 400 Units by mouth in the morning.     No current facility-administered medications for this visit.     PHYSICAL EXAMINATION: ECOG PERFORMANCE STATUS: 1 - Symptomatic but completely ambulatory Vitals:   06/23/22 1339  BP: (!) 101/57  Pulse: 92  Resp: 18  Temp: (!) 96 F (35.6 C)  SpO2: 100%   Filed Weights   06/23/22 1339  Weight: 234 lb 12.8 oz (106.5 kg)    Physical Exam Constitutional:      General: She is not in acute distress.    Appearance: She is obese.  HENT:     Head: Normocephalic and atraumatic.  Eyes:     General: No scleral icterus. Cardiovascular:     Rate and Rhythm: Normal rate and regular rhythm.     Heart sounds: Normal heart sounds.  Pulmonary:     Effort: Pulmonary effort is normal. No respiratory distress.     Breath sounds: No wheezing.  Abdominal:     General: Bowel sounds are normal. There is no distension.     Palpations: Abdomen is soft.  Musculoskeletal:        General: No deformity. Normal range of motion.     Cervical back: Normal range of motion and neck supple.  Skin:    General: Skin is warm and dry.     Findings: No erythema or rash.  Neurological:     Mental Status: She is alert and oriented to person, place, and time. Mental status is at baseline.     Cranial Nerves: No cranial nerve deficit.     Coordination: Coordination normal.  Psychiatric:        Mood and Affect: Mood normal.     LABORATORY DATA:  I have reviewed the data as listed Lab Results  Component Value Date   WBC 4.1 06/21/2022   HGB 9.6  (L) 06/21/2022   HCT 30.2 (L) 06/21/2022   MCV 97.1 06/21/2022   PLT 251 06/21/2022   Recent Labs    12/22/21 1110 02/26/22 0609 02/26/22 0647 06/21/22 1039  NA 126* 132*  --  137  K 4.1 4.2  --  4.5  CL 91* 99  --  105  CO2 27 23  --  24  GLUCOSE 101* 133*  --  95  BUN 7* 26*  --  34*  CREATININE 0.53 0.92  --  0.91  CALCIUM 8.9 9.0  --  9.1  GFRNONAA >60 >60  --  >60  PROT 7.8 7.4  --  7.4  ALBUMIN 3.6 3.5  --  3.9  AST 19 67*  --  18  ALT 13 35  --  11  ALKPHOS 55 55  --  52  BILITOT 0.3 1.7* 1.5* <0.1*  BILIDIR  --   --  0.4*  --   IBILI  --   --  1.1*  --     Lab Results  Component Value Date   HGB 9.6 (L) 06/21/2022   TIBC 389 06/21/2022   IRONPCTSAT 11 06/21/2022   FERRITIN 26 06/21/2022     RADIOGRAPHIC STUDIES: I have personally reviewed the radiological images as listed and agreed with the findings in the report. No results found.

## 2022-06-23 NOTE — Assessment & Plan Note (Signed)
US pelvis showed  Simple 3.2 cm right adnexal cyst. O-RADS 2.

## 2022-06-23 NOTE — Assessment & Plan Note (Signed)
She has follow up appointment with [facility] for checking ADAMTS level.

## 2022-06-23 NOTE — Assessment & Plan Note (Addendum)
#   lymphadenopathy  March 2024 CT abdomen pelvis at [facility] showed Decreased size of previously enlarged right pelvic sidewall lymph node, favored reactive  If she does have a low grade lymphoproliferative disorder, LN may response to the retuximab.

## 2022-06-23 NOTE — Assessment & Plan Note (Signed)
patient has negative intrinsic factor antibodies and antiparietal antibody. Recommend patient to continue sublingual B12, decrease dose to 3 times per week.

## 2022-08-31 ENCOUNTER — Other Ambulatory Visit: Payer: Self-pay | Admitting: Family Medicine

## 2022-08-31 DIAGNOSIS — Z1231 Encounter for screening mammogram for malignant neoplasm of breast: Secondary | ICD-10-CM

## 2022-09-27 ENCOUNTER — Ambulatory Visit
Admission: RE | Admit: 2022-09-27 | Discharge: 2022-09-27 | Disposition: A | Discharge status: Home / Self Care | Payer: Medicare Other | Source: Ambulatory Visit | Attending: Family Medicine | Admitting: Family Medicine

## 2022-09-27 DIAGNOSIS — Z1231 Encounter for screening mammogram for malignant neoplasm of breast: Secondary | ICD-10-CM | POA: Diagnosis present

## 2023-06-21 ENCOUNTER — Inpatient Hospital Stay: Payer: Medicare Other | Attending: Oncology

## 2023-06-21 ENCOUNTER — Other Ambulatory Visit: Payer: Medicare Other

## 2023-06-21 DIAGNOSIS — D649 Anemia, unspecified: Secondary | ICD-10-CM | POA: Insufficient documentation

## 2023-06-21 DIAGNOSIS — M3119 Other thrombotic microangiopathy: Secondary | ICD-10-CM | POA: Insufficient documentation

## 2023-06-21 DIAGNOSIS — R591 Generalized enlarged lymph nodes: Secondary | ICD-10-CM | POA: Insufficient documentation

## 2023-06-21 DIAGNOSIS — Z87891 Personal history of nicotine dependence: Secondary | ICD-10-CM | POA: Insufficient documentation

## 2023-06-21 DIAGNOSIS — N83201 Unspecified ovarian cyst, right side: Secondary | ICD-10-CM | POA: Diagnosis not present

## 2023-06-21 DIAGNOSIS — Z803 Family history of malignant neoplasm of breast: Secondary | ICD-10-CM | POA: Insufficient documentation

## 2023-06-21 LAB — CBC WITH DIFFERENTIAL (CANCER CENTER ONLY)
Abs Immature Granulocytes: 0.03 10*3/uL (ref 0.00–0.07)
Basophils Absolute: 0 10*3/uL (ref 0.0–0.1)
Basophils Relative: 1 %
Eosinophils Absolute: 0.2 10*3/uL (ref 0.0–0.5)
Eosinophils Relative: 4 %
HCT: 29.3 % — ABNORMAL LOW (ref 36.0–46.0)
Hemoglobin: 9.5 g/dL — ABNORMAL LOW (ref 12.0–15.0)
Immature Granulocytes: 1 %
Lymphocytes Relative: 22 %
Lymphs Abs: 1.2 10*3/uL (ref 0.7–4.0)
MCH: 30.9 pg (ref 26.0–34.0)
MCHC: 32.4 g/dL (ref 30.0–36.0)
MCV: 95.4 fL (ref 80.0–100.0)
Monocytes Absolute: 0.8 10*3/uL (ref 0.1–1.0)
Monocytes Relative: 14 %
Neutro Abs: 3.2 10*3/uL (ref 1.7–7.7)
Neutrophils Relative %: 58 %
Platelet Count: 215 10*3/uL (ref 150–400)
RBC: 3.07 MIL/uL — ABNORMAL LOW (ref 3.87–5.11)
RDW: 14.2 % (ref 11.5–15.5)
WBC Count: 5.6 10*3/uL (ref 4.0–10.5)
nRBC: 0 % (ref 0.0–0.2)

## 2023-06-21 LAB — CMP (CANCER CENTER ONLY)
ALT: 10 U/L (ref 0–44)
AST: 15 U/L (ref 15–41)
Albumin: 3.8 g/dL (ref 3.5–5.0)
Alkaline Phosphatase: 59 U/L (ref 38–126)
Anion gap: 7 (ref 5–15)
BUN: 32 mg/dL — ABNORMAL HIGH (ref 8–23)
CO2: 23 mmol/L (ref 22–32)
Calcium: 9.5 mg/dL (ref 8.9–10.3)
Chloride: 107 mmol/L (ref 98–111)
Creatinine: 0.97 mg/dL (ref 0.44–1.00)
GFR, Estimated: >60 mL/min (ref >60)
Glucose, Bld: 96 mg/dL (ref 70–99)
Potassium: 4.8 mmol/L (ref 3.5–5.1)
Sodium: 137 mmol/L (ref 135–145)
Total Bilirubin: 0.5 mg/dL (ref 0.0–1.2)
Total Protein: 7.3 g/dL (ref 6.5–8.1)

## 2023-06-21 LAB — IRON AND TIBC
Iron: 90 ug/dL (ref 28–170)
Saturation Ratios: 34 % — ABNORMAL HIGH (ref 10.4–31.8)
TIBC: 266 ug/dL (ref 250–450)
UIBC: 176 ug/dL

## 2023-06-21 LAB — RETIC PANEL
Immature Retic Fract: 7.5 % (ref 2.3–15.9)
RBC.: 3.07 MIL/uL — ABNORMAL LOW (ref 3.87–5.11)
Retic Count, Absolute: 48.8 10*3/uL (ref 19.0–186.0)
Retic Ct Pct: 1.6 % (ref 0.4–3.1)
Reticulocyte Hemoglobin: 33.1 pg (ref >27.9)

## 2023-06-21 LAB — VITAMIN B12: Vitamin B-12: 819 pg/mL (ref 180–914)

## 2023-06-21 LAB — LACTATE DEHYDROGENASE: LDH: 121 U/L (ref 98–192)

## 2023-06-21 LAB — FERRITIN: Ferritin: 199 ng/mL (ref 11–307)

## 2023-06-22 ENCOUNTER — Inpatient Hospital Stay: Payer: Medicare Other | Admitting: Oncology

## 2023-06-22 ENCOUNTER — Encounter: Payer: Self-pay | Admitting: Oncology

## 2023-06-22 ENCOUNTER — Ambulatory Visit: Payer: Medicare Other | Admitting: Oncology

## 2023-06-22 VITALS — BP 142/71 | HR 69 | Temp 98.3°F | Resp 20 | Wt 266.3 lb

## 2023-06-22 DIAGNOSIS — Z862 Personal history of diseases of the blood and blood-forming organs and certain disorders involving the immune mechanism: Secondary | ICD-10-CM | POA: Diagnosis not present

## 2023-06-22 DIAGNOSIS — N9489 Other specified conditions associated with female genital organs and menstrual cycle: Secondary | ICD-10-CM

## 2023-06-22 DIAGNOSIS — R591 Generalized enlarged lymph nodes: Secondary | ICD-10-CM | POA: Diagnosis not present

## 2023-06-22 DIAGNOSIS — M3119 Other thrombotic microangiopathy: Secondary | ICD-10-CM | POA: Diagnosis not present

## 2023-06-22 DIAGNOSIS — D649 Anemia, unspecified: Secondary | ICD-10-CM

## 2023-06-22 NOTE — Assessment & Plan Note (Signed)
#   lymphadenopathy  March 2024 CT abdomen pelvis at [facility] showed Decreased size of previously enlarged right pelvic sidewall lymph node, favored reactive  If she does have a low grade lymphoproliferative disorder, lymph node may have also responded to the retuximab which was given for TTP treatments.Aaron Aas

## 2023-06-22 NOTE — Assessment & Plan Note (Addendum)
 She gets ADAMTS13 and CBC drawn every 3 months at [facility] and a continue follow-up with [facility] hematology

## 2023-06-22 NOTE — Assessment & Plan Note (Addendum)
 Labs are reviewed and discussed with patient. Lab Results  Component Value Date   HGB 9.5 (L) 06/21/2023   TIBC 266 06/21/2023   IRONPCTSAT 34 (H) 06/21/2023   FERRITIN 199 06/21/2023    Stable iron panel and increased iron saturation.  Recommend patient to hold off iron supplementation. Chronic anemia with stable hemoglobin.  Likely due to chronic autoimmune disease.

## 2023-06-22 NOTE — Progress Notes (Signed)
 ,Hematology/Oncology Progress note [phone] [phone]      Patient Care Team: Alexander Anes, MD as PCP - General (Family Medicine) Timmy Forbes, MD as Consulting Physician (Hematology and Oncology)  CHIEF COMPLAINTS/REASON FOR VISIT:  Anemia and lymphadenopathy, B12 deficiency, history of TTP  ASSESSMENT & PLAN:   History of TTP (thrombotic thrombocytopenic purpura) She gets ADAMTS13 and CBC drawn every 3 months at [facility] and a continue follow-up with [facility]   Adnexal mass on CT ABdomen 03/31/21  US  pelvis showed  Simple 3.2 cm right adnexal cyst. O-RADS 2.  She needs to have repeat US  done at [facility] - due now  Normocytic anemia Labs are reviewed and discussed with patient. Lab Results  Component Value Date   HGB 9.5 (L) 06/21/2023   TIBC 266 06/21/2023   IRONPCTSAT 34 (H) 06/21/2023   FERRITIN 199 06/21/2023    Stable iron panel and increased iron saturation.  Recommend patient to hold off iron supplementation. Chronic anemia with stable hemoglobin.  Likely due to chronic autoimmune disease.  Lymphadenopathy # lymphadenopathy  March 2024 CT abdomen pelvis at [facility] showed Decreased size of previously enlarged right pelvic sidewall lymph node, favored reactive  If she does have a low grade lymphoproliferative disorder, lymph node may have also responded to the retuximab which was given for TTP treatments..     No orders of the defined types were placed in this encounter.  Follow up PRN All questions were answered. The patient knows to call the clinic with any problems, questions or concerns.  Timmy Forbes, MD, PhD [facility] Hematology Oncology 06/22/2023     HISTORY OF PRESENTING ILLNESS:   Donna Austin is a  73 y.o.  female with PMH listed below was seen in consultation at the request of  Alexander Anes, MD  for evaluation of leukopenia and a large lymph node  Patient has history of mixed connective tissue disease, discoid lupus,  Raynaud's disease, osteoarthritis and follows up with Dr. Kernodle.  Patient was previously on methotrexate treatment which was stopped due to neutropenia.  She is currently on Imuran, low-dose steroids and Plaquenil .  Patient had a CT chest done in February 2022 for evaluation of dyspnea on exertion. 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  No definite acute pulmonary abnormality.  Severe atherosclerosis.  Numerous lymph nodes are slightly more prominent in the number and a size 10 typically seen but nonspecific.  Patient also had a blood work done on 08/11/2020. CBC showed hemoglobin 10.3, MCV 99, platelet count 287, white count 5.4. Differential showed decreased absolute lymphocytes 0.46, increased monocyte percentage 19.4, decreased eosinophil percentage.  Normal ANC and neutrophil percentage. Patient denies any unintentional weight loss, fever, severe night sweats.  She endorses intermittent hot flash and sweating.   11/10/2020, CT chest without contrast showed nonspecific prominent but nonenlarged mediastinal lymph nodes are stable compared to prior study.  Favor benign etiology. Interstitial lung disease, slightly progressive compared to prior study.  Dilatation of pulmonary trunk concerning of associated pulmonary arterial hypertension.  Aortic atherosclerosis  03/31/2021 - 04/02/2021, patient was admitted due to rectal bleeding.  CT abdomen 03/31/2021 showed sigmoid diverticulosis but no active ductulitis identified.  Dilated appearance of the ascending and transverse colon.  Mildly dilated distal esophagus.  Mildly enlarged right external iliac lymph nodes, reactive versus occult neoplastic process.  3.6 x 2.2 cm simple appearing right ovarian adnexal lesion.  Recommend follow-up  ultrasound in 6 to 12 months.  Patient's hemoglobin went down to 8.  Hematochezia was felt to be secondary to diverticular bleeding.  Per note,  she had colonoscopy showed diverticular disease.  Colonoscopy results were not available in our current EMR.   CTA abdomen showed no evidence of active intraluminal hemorrhage.  Again marked gaseous distention of the right and transverse colon without wall thickening or bowel obstruction.  Bilateral common iliac and bilateral external iliac lymphadenopathy.  Feb 2024 TTP, initially presented to [facility], w/ hgb 4.7, plts 5, elevated ddimer, elevated LDH, and with schistocytes on smear in the setting of several days of fevers and gastroenteritis symptoms, visual hallucination. She was sent to [facility] due to suspected TTP, later confirmed with ADAMTS 1%. She was treated with plasma exchange. High dose prednisone , cabilivizumab. Count improved and patient eventully got Rituximab weekly x 4. She was also started on plaquenil  400mg  daily for Discoid lupus. She is off Imuran.   INTERVAL HISTORY Donna Austin is a 73 y.o. female who has above history reviewed by me today presents for follow up visit for anemia and lymphadenopathy, B12 deficiency and history of TTP Patient gets CBC and ADAMTS13 levels checked at [facility] every 3 months. Patient is on Imuran and Plaquenil  for mixed connective tissue disease  Today she reports feeling well. Denies nausea, vomiting, diarrhea, abdominal pain.  She takes ferrous sulfate every other day, has constipation.    Review of Systems  Constitutional:  Negative for appetite change, chills, fatigue and fever.  HENT:   Negative for hearing loss and voice change.   Eyes:  Negative for eye problems.  Respiratory:  Negative for chest tightness and cough.   Cardiovascular:  Negative for chest pain.  Gastrointestinal:  Negative for abdominal distention, abdominal pain and blood in stool.  Genitourinary:  Negative for difficulty urinating and frequency.   Musculoskeletal:  Negative for arthralgias.  Skin:  Negative for itching and rash.  Neurological:  Negative for extremity weakness.   Hematological:  Negative for adenopathy.  Psychiatric/Behavioral:  Negative for confusion.     MEDICAL HISTORY:  Past Medical History:  Diagnosis Date   Anemia    Arthritis    Celiac disease    Connective tissue disease overlap syndrome (HCC)    Elevated CPK    Hyperlipidemia    Hypertension    Lupus    Obesity    Raynaud's disease    Raynaud's disease    Sleep apnea     SURGICAL HISTORY: Past Surgical History:  Procedure Laterality Date   ABDOMINAL HYSTERECTOMY     COLONOSCOPY WITH PROPOFOL  N/A 11/09/2015   Procedure: COLONOSCOPY WITH PROPOFOL ;  Surgeon: Cassie Click, MD;  Location: [facility] ENDOSCOPY;  Service: Endoscopy;  Laterality: N/A;   RIGHT/LEFT HEART CATH AND CORONARY ANGIOGRAPHY N/A 06/10/2020   Procedure: RIGHT/LEFT HEART CATH AND CORONARY ANGIOGRAPHY;  Surgeon: Antonette Batters, MD;  Location: [facility] INVASIVE CV LAB;  Service: Cardiovascular;  Laterality: N/A;    SOCIAL HISTORY: Patient lives in [place].  She works as a Systems developer for a company.  Plans to retire. Social History   Socioeconomic History   Marital status: Single    Spouse name: Not on file   Number of children: 1   Years of education: Not on file   Highest education level: Not on file  Occupational History   Not on file  Tobacco Use   Smoking status: Former   Smokeless tobacco: Never  Vaping Use   Vaping status:  Never Used  Substance and Sexual Activity   Alcohol use: Yes    Comment: occasional   Drug use: No   Sexual activity: Not on file  Other Topics Concern   Not on file  Social History Narrative   Lives by herself. Son lives in [place] & sees pt. Often. 3 grandchildren    Social Drivers of Corporate investment banker Strain: Low Risk  (08/11/2022)   Received from [facility]   Overall Financial Resource Strain (CARDIA)    Difficulty of Paying Living Expenses: Not very hard  Food Insecurity: No Food Insecurity (08/11/2022)   Received from [facility]   Hunger Vital Sign    Worried About Running Out of Food in the Last Year: Never true    Ran Out of Food in the Last Year: Never true  Transportation Needs: No Transportation Needs (08/11/2022)   Received from [facility] - Transportation    In the past 12 months, has lack of transportation kept you from medical appointments or from getting medications?: No    Lack of Transportation (Non-Medical): No  Physical Activity: Not on file  Stress: Not on file  Social Connections: Not on file  Intimate Partner Violence: Not on file    FAMILY HISTORY: Family History  Problem Relation Age of Onset   Breast cancer Cousin    Breast cancer Maternal Grandmother    Breast cancer Sister 34    ALLERGIES:  is allergic to gluten meal, methotrexate derivatives, other, and latex.  MEDICATIONS:  Current Outpatient Medications  Medication Sig Dispense Refill   acetaminophen  (TYLENOL ) 650 MG CR tablet Take 650 mg by mouth every 8 (eight) hours as needed for pain.     amLODipine  (NORVASC ) 10 MG tablet Take 10 mg by mouth in the morning.     atorvastatin  (LIPITOR) 20 MG tablet Take 20 mg by mouth every Monday, Wednesday, and Friday at 8 PM.     cholecalciferol  (VITAMIN D) 25 MCG (1000 UNIT) tablet Take 1,000 Units by mouth in the morning.     cyanocobalamin  (VITAMIN B12) 1000 MCG tablet Take 1 tablet (1,000 mcg total) by mouth daily. 90 tablet 1   fluticasone  (FLONASE ) 50 MCG/ACT nasal spray Place 1 spray into both nostrils daily as needed for allergies or rhinitis.     hydrALAZINE  (APRESOLINE ) 50 MG tablet Take 50 mg by mouth 2 (two) times daily.     hydrochlorothiazide  (HYDRODIURIL ) 25 MG tablet Take 25 mg by mouth in the morning.     hydroxychloroquine  (PLAQUENIL ) 200 MG tablet Take 200 mg by mouth in the morning.     irbesartan  (AVAPRO ) 300 MG tablet Take 300 mg by mouth in the morning.     magnesium citrate SOLN Take 1 Bottle by mouth once.      spironolactone (ALDACTONE) 25 MG tablet Take 1 tablet by mouth daily.     torsemide (DEMADEX) 20 MG tablet Take by mouth.     vitamin C (ASCORBIC ACID ) 500 MG tablet Take 500 mg by mouth in the morning.     vitamin E  180 MG (400 UNITS) capsule Take 400 Units by mouth in the morning.     ondansetron  (ZOFRAN -ODT) 4 MG disintegrating tablet Take 1 tablet (4 mg total) by mouth every 8 (eight) hours as needed for nausea or vomiting. 30 tablet 0   pantoprazole  (PROTONIX ) 20 MG tablet Take 20 mg by mouth daily before breakfast.  No current facility-administered medications for this visit.     PHYSICAL EXAMINATION: ECOG PERFORMANCE STATUS: 1 - Symptomatic but completely ambulatory Vitals:   06/22/23 1342  BP: (!) 142/71  Pulse: 69  Resp: 20  Temp: 98.3 F (36.8 C)  SpO2: 100%   Filed Weights   06/22/23 1342  Weight: 266 lb 4.8 oz (120.8 kg)    Physical Exam Constitutional:      General: She is not in acute distress.    Appearance: She is obese.  HENT:     Head: Normocephalic and atraumatic.  Eyes:     General: No scleral icterus. Cardiovascular:     Rate and Rhythm: Normal rate and regular rhythm.     Heart sounds: Normal heart sounds.  Pulmonary:     Effort: Pulmonary effort is normal. No respiratory distress.     Breath sounds: No wheezing.  Abdominal:     General: Bowel sounds are normal. There is no distension.     Palpations: Abdomen is soft.  Musculoskeletal:        General: No deformity. Normal range of motion.     Cervical back: Normal range of motion and neck supple.  Skin:    General: Skin is warm and dry.     Findings: No erythema or rash.  Neurological:     Mental Status: She is alert and oriented to person, place, and time. Mental status is at baseline.     Cranial Nerves: No cranial nerve deficit.     Coordination: Coordination normal.  Psychiatric:        Mood and Affect: Mood normal.     LABORATORY DATA:  I have reviewed the data as listed Lab  Results  Component Value Date   WBC 5.6 06/21/2023   HGB 9.5 (L) 06/21/2023   HCT 29.3 (L) 06/21/2023   MCV 95.4 06/21/2023   PLT 215 06/21/2023   Recent Labs    06/21/23 1111  NA 137  K 4.8  CL 107  CO2 23  GLUCOSE 96  BUN 32*  CREATININE 0.97  CALCIUM  9.5  GFRNONAA >60  PROT 7.3  ALBUMIN 3.8  AST 15  ALT 10  ALKPHOS 59  BILITOT 0.5   Lab Results  Component Value Date   HGB 9.5 (L) 06/21/2023   TIBC 266 06/21/2023   IRONPCTSAT 34 (H) 06/21/2023   FERRITIN 199 06/21/2023     RADIOGRAPHIC STUDIES: I have personally reviewed the radiological images as listed and agreed with the findings in the report. No results found.

## 2023-06-22 NOTE — Assessment & Plan Note (Signed)
 US  pelvis showed  Simple 3.2 cm right adnexal cyst. O-RADS 2.  She needs to have repeat US  done at [facility] - due now

## 2023-06-28 ENCOUNTER — Other Ambulatory Visit: Payer: Self-pay | Admitting: Pulmonary Disease

## 2023-06-28 DIAGNOSIS — R590 Localized enlarged lymph nodes: Secondary | ICD-10-CM

## 2023-06-28 DIAGNOSIS — J849 Interstitial pulmonary disease, unspecified: Secondary | ICD-10-CM

## 2023-07-11 ENCOUNTER — Ambulatory Visit
Admission: RE | Admit: 2023-07-11 | Discharge: 2023-07-11 | Disposition: A | Discharge status: Home / Self Care | Source: Ambulatory Visit | Attending: Pulmonary Disease | Admitting: Pulmonary Disease

## 2023-07-11 DIAGNOSIS — J849 Interstitial pulmonary disease, unspecified: Secondary | ICD-10-CM | POA: Insufficient documentation

## 2023-07-11 DIAGNOSIS — R590 Localized enlarged lymph nodes: Secondary | ICD-10-CM | POA: Diagnosis present

## 2023-07-11 MED ORDER — IOHEXOL 300 MG/ML  SOLN
75.0000 mL | Freq: Once | INTRAMUSCULAR | Status: AC | PRN
  Administered 2023-07-11: 75 mL via INTRAVENOUS

## 2023-09-07 ENCOUNTER — Other Ambulatory Visit: Payer: Self-pay | Admitting: Family Medicine

## 2023-09-07 DIAGNOSIS — Z1231 Encounter for screening mammogram for malignant neoplasm of breast: Secondary | ICD-10-CM

## 2023-10-02 ENCOUNTER — Ambulatory Visit
Admission: RE | Admit: 2023-10-02 | Discharge: 2023-10-02 | Disposition: A | Discharge status: Home / Self Care | Source: Ambulatory Visit | Attending: Family Medicine | Admitting: Family Medicine

## 2023-10-02 DIAGNOSIS — Z1231 Encounter for screening mammogram for malignant neoplasm of breast: Secondary | ICD-10-CM | POA: Insufficient documentation

## 2023-11-30 IMAGING — CT CT CHEST HIGH RESOLUTION
2 of 7 series · 14 of 36 positions shown, 17 images · non-contrast
Comparison: 11/10/2020 chest CT.

CLINICAL DATA: Dyspnea.  Follow-up interstitial lung disease.

EXAM:
CT CHEST WITHOUT CONTRAST
TECHNIQUE: Multidetector CT imaging of the chest was performed following the
standard protocol without intravenous contrast. High resolution
imaging of the lungs, as well as inspiratory and expiratory imaging,
was performed.

[Series 5: high resolution retro · axial · 0.68mm/px · z∈[-318,-76]mm · 11 of 286 slices shown, 14 images]
[im 22/286  mediastinal]
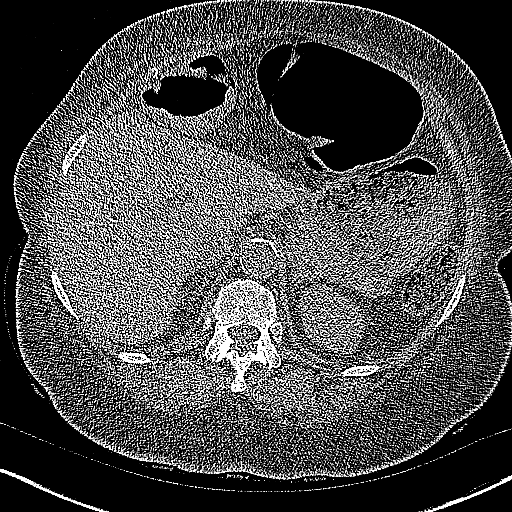
[im 22/286  lung]
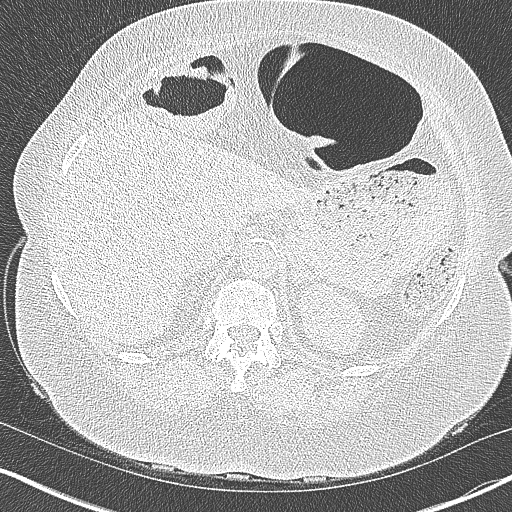
[im 44/286  lung]
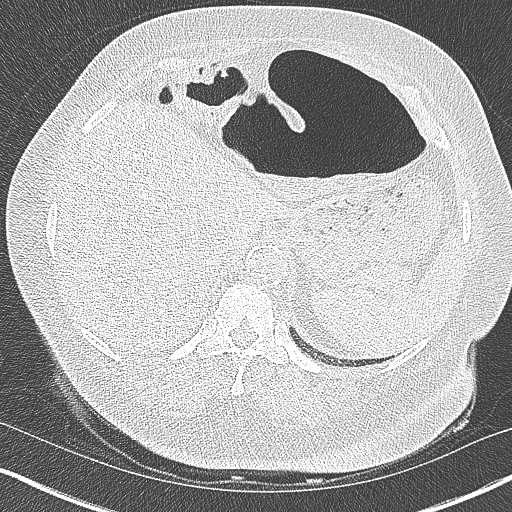
[im 66/286  lung]
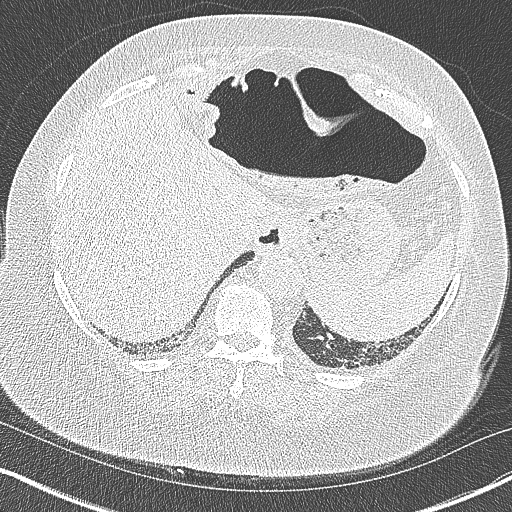
[im 88/286  lung]
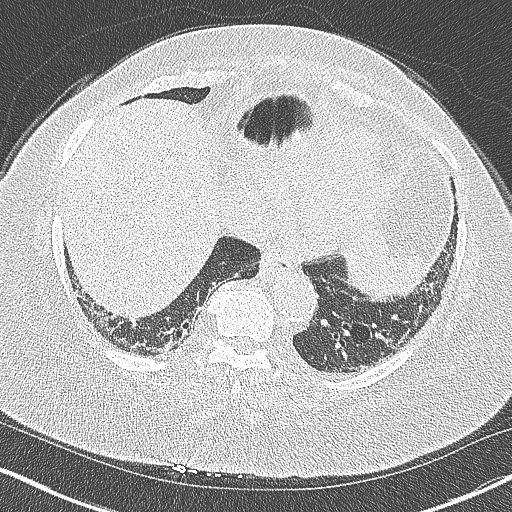
[im 110/286  mediastinal]
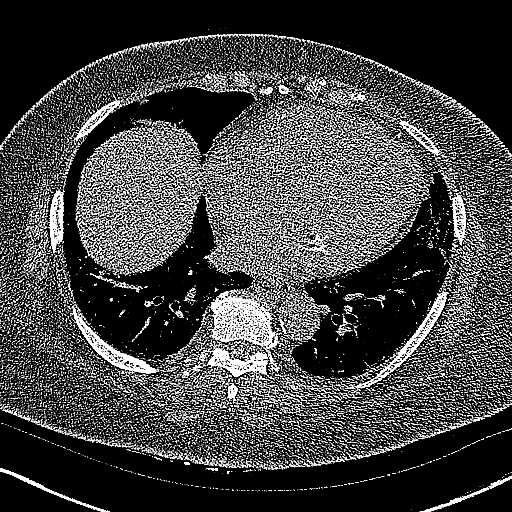
[im 110/286  lung]
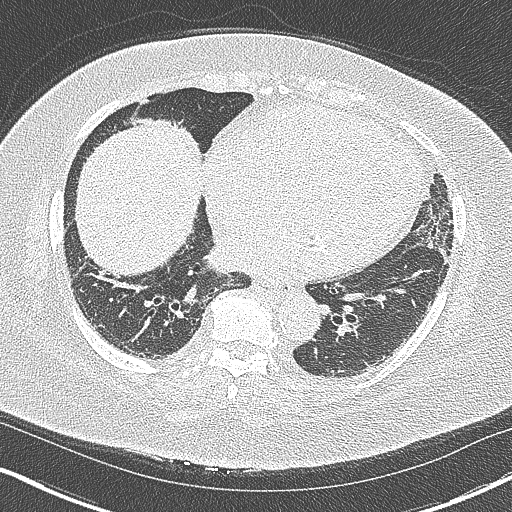
[im 154/286  lung]
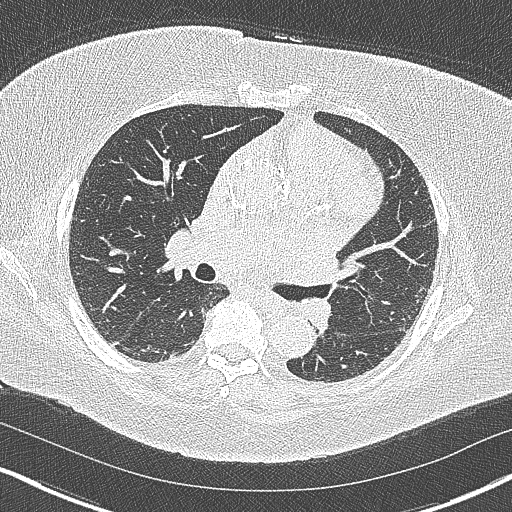
[im 176/286  lung]
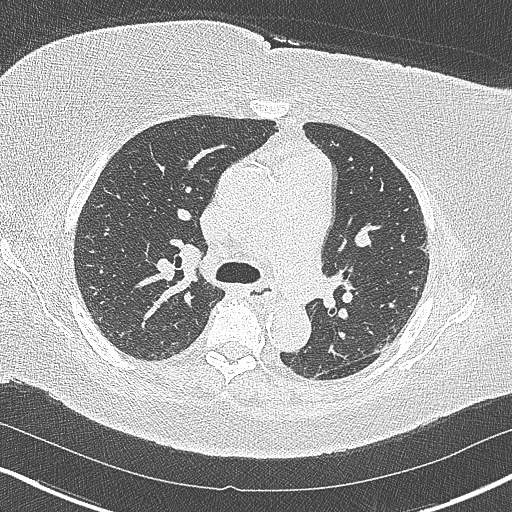
[im 198/286  lung]
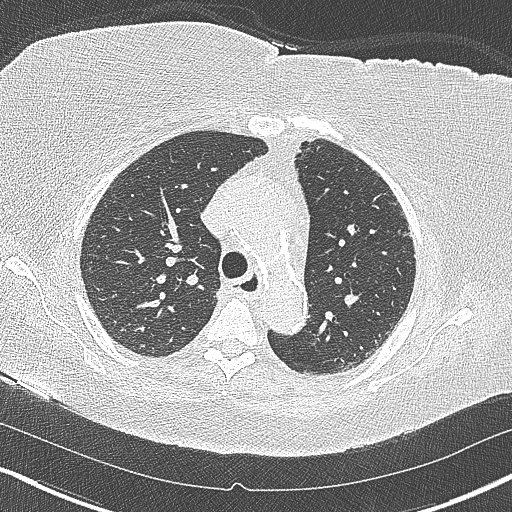
[im 220/286  mediastinal]
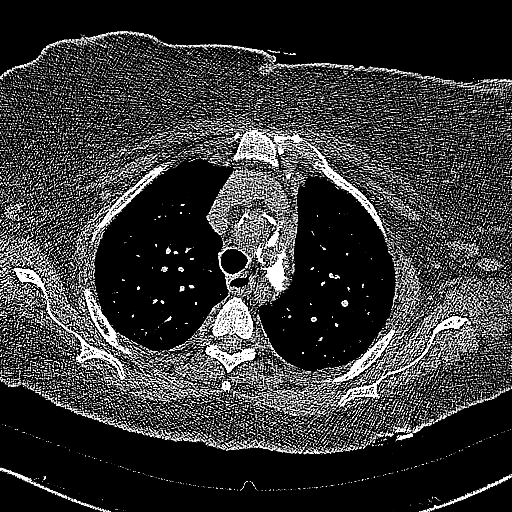
[im 220/286  lung]
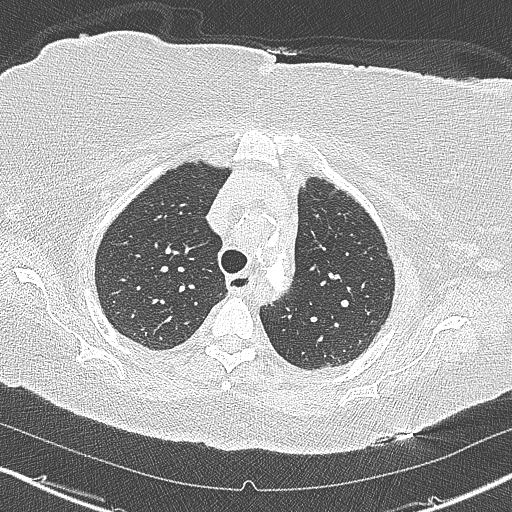
[im 242/286  lung]
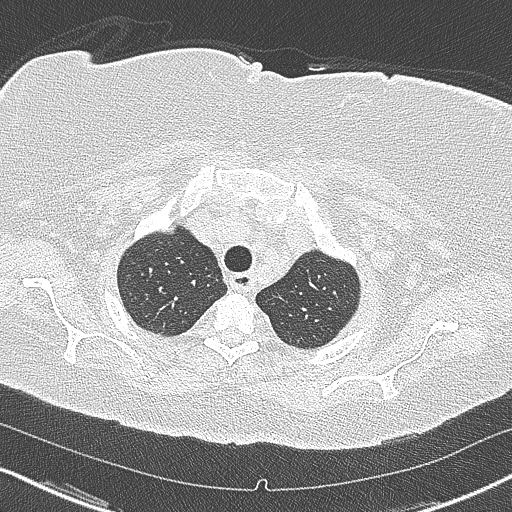
[im 264/286  lung]
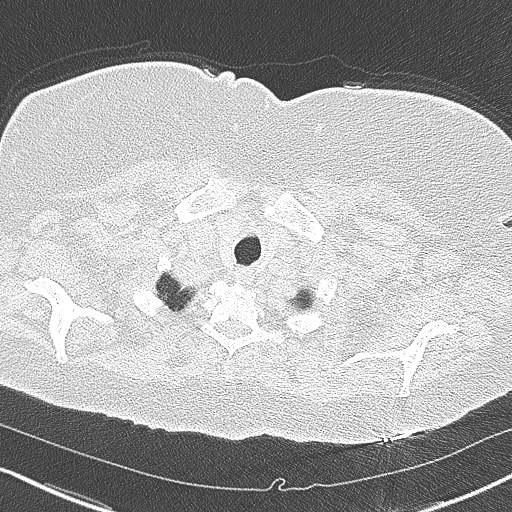

[Series 8: coronal · coronal · 0.63mm/px · 3 of 111 slices shown]
[im 23/111  lung]
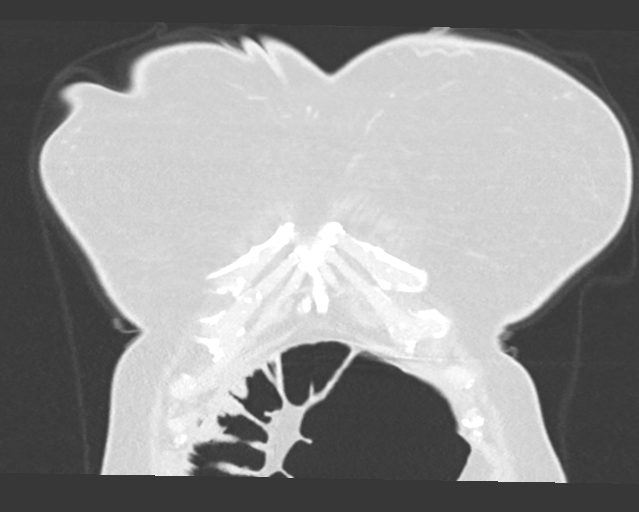
[im 45/111  lung]
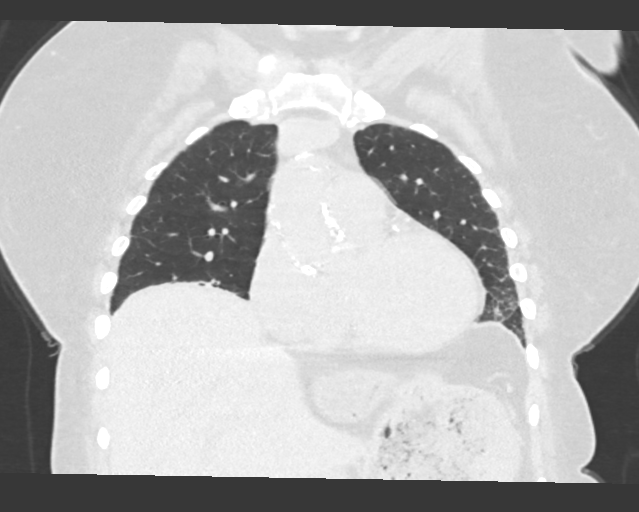
[im 67/111  lung]
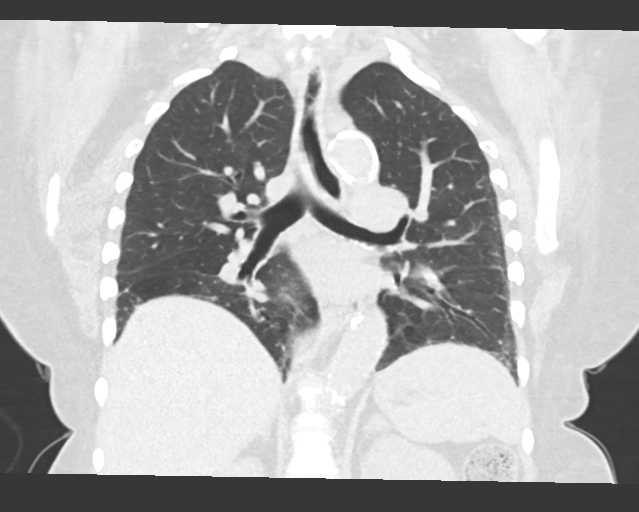

[14 of 36 positions shown; findings below may reference images not displayed]

FINDINGS: Cardiovascular: Stable mild cardiomegaly. No significant pericardial
effusion/thickening. Three-vessel coronary atherosclerosis.
Atherosclerotic thoracic aorta with dilated 4.1 cm ascending
thoracic aorta, stable. Stable dilated main pulmonary artery (4.0 cm
diameter).

Mediastinum/Nodes: No discrete thyroid nodules. Unremarkable
esophagus. Mildly enlarged bilateral axillary lymph nodes measuring
up to 1.1 cm short axis diameter on the right (series 2/image 35)
and 1.2 cm on the left (series 2/image 27), not substantially
changed. Stable mildly enlarged 1.0 cm left prevascular node (series
2/image 38). No discrete hilar adenopathy on these noncontrast
images.

Lungs/Pleura: No pneumothorax. No pleural effusion. Tiny 2 mm
posteromedial left upper lobe solid pulmonary nodule (series 3/image
50), stable. No acute consolidative airspace disease, lung masses or
new significant pulmonary nodules. No significant lobular air
trapping or evidence of tracheobronchomalacia on the expiration
sequence. Moderate patchy subpleural reticulation and ground-glass
opacity in both lungs with associated mild-to-moderate traction
bronchiectasis, architectural distortion and volume loss. There is a
strong basilar predominance to these findings. No frank
honeycombing. No appreciable interval progression since recent
11/10/2020 chest CT. Slight progression since baseline outside
03/21/2020 chest CT.

Upper abdomen: Left adrenal 1.6 cm nodule with density 33 HU,
stable, presumably a benign adenoma.

Musculoskeletal: No aggressive appearing focal osseous lesions.
Moderate thoracic spondylosis.
IMPRESSION: 1. Spectrum of findings compatible with basilar predominant fibrotic
interstitial lung disease without frank honeycombing and without
appreciable interval progression since recent 11/10/2020 chest CT.
Slight progression since baseline outside 03/21/2020 chest CT.
Findings are categorized as probable UIP per consensus guidelines:
Diagnosis of Idiopathic Pulmonary Fibrosis: An Official
ATS/ERS/JRS/ALAT Clinical Practice Guideline. Am J Respir Crit Care
Med Vol 198, Lienad 5, [id number][DATE].
2. Stable mild cardiomegaly. Three-vessel coronary atherosclerosis.
3. Stable dilated main pulmonary artery, suggesting chronic
pulmonary arterial hypertension.
4. Stable left adrenal adenoma.
5. Aortic Atherosclerosis ([id number]-[id number]).

## 2024-02-07 IMAGING — CT CT ABD-PELV W/ CM
2 of 5 series · 14 of 46 positions shown, 16 images · IV contrast (APPLIED)
Comparison: Abdominal ultrasound 10/25/2012

CLINICAL DATA: Left lower quadrant abdominal pain.

EXAM:
CT ABDOMEN AND PELVIS WITH CONTRAST
TECHNIQUE: Multidetector CT imaging of the abdomen and pelvis was performed
using the standard protocol following bolus administration of
intravenous contrast.

[Series 2: abdomen 5.0 · axial · 0.89mm/px · z∈[-950,-530]mm · 11 of 99 slices shown, 13 images]
[im 8/99  soft-tissue]
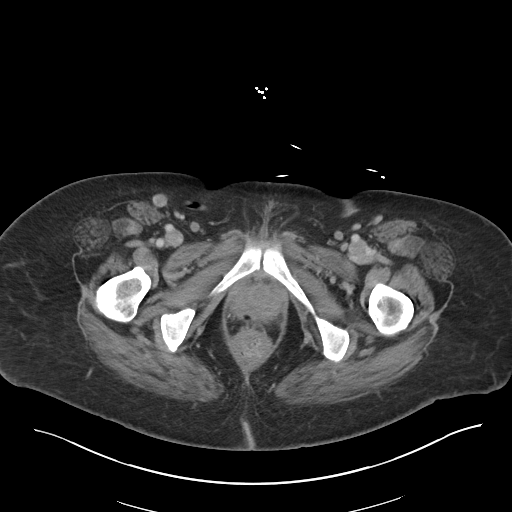
[im 8/99  bone]
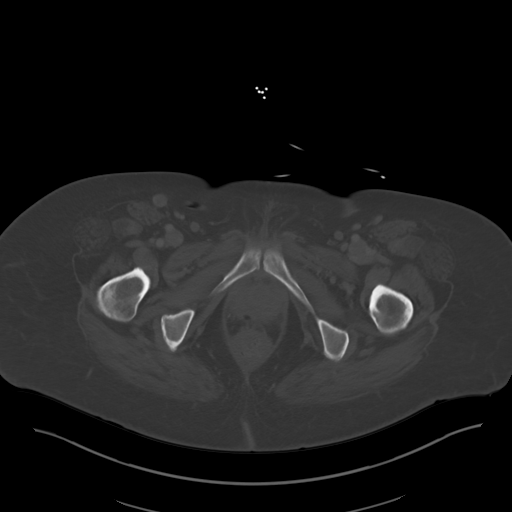
[im 15/99  soft-tissue]
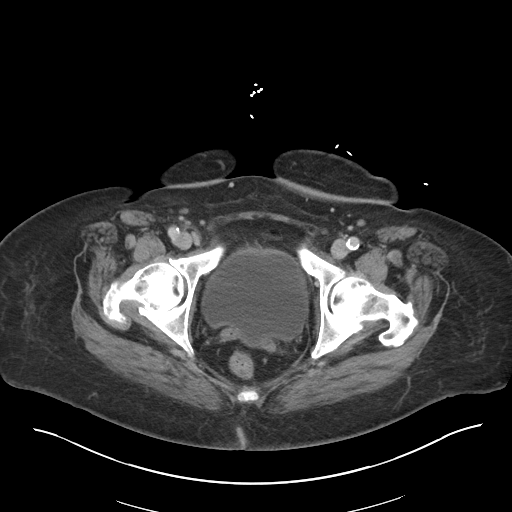
[im 22/99  soft-tissue]
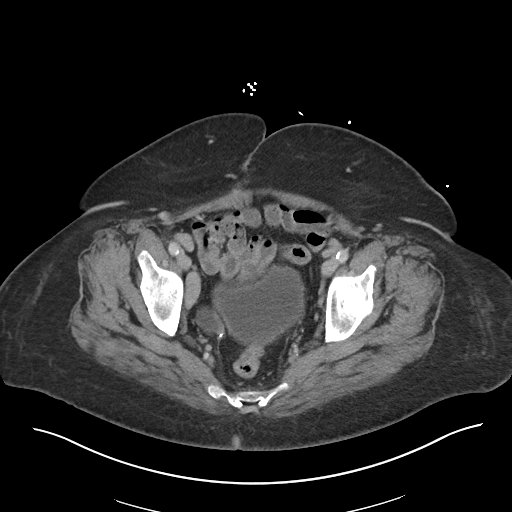
[im 36/99  soft-tissue]
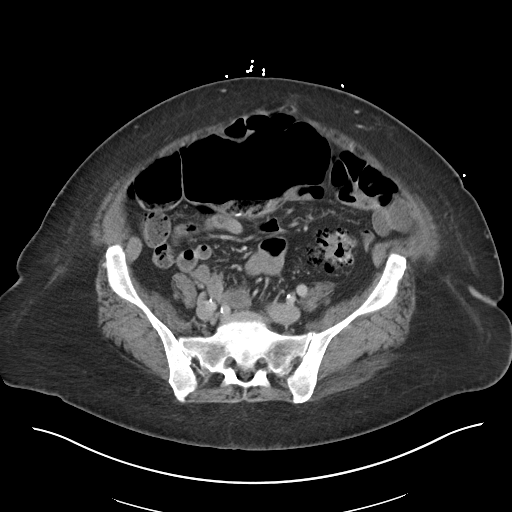
[im 43/99  soft-tissue]
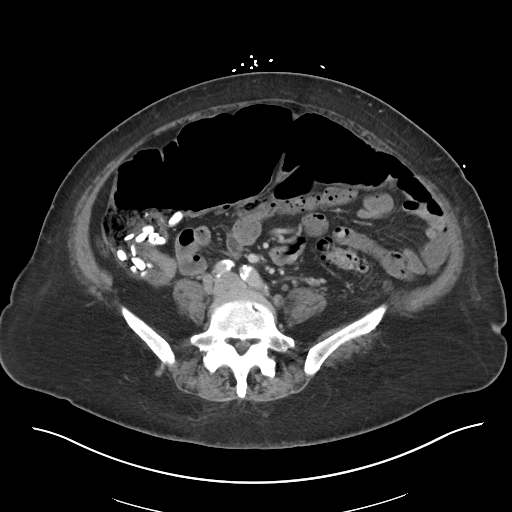
[im 50/99  soft-tissue]
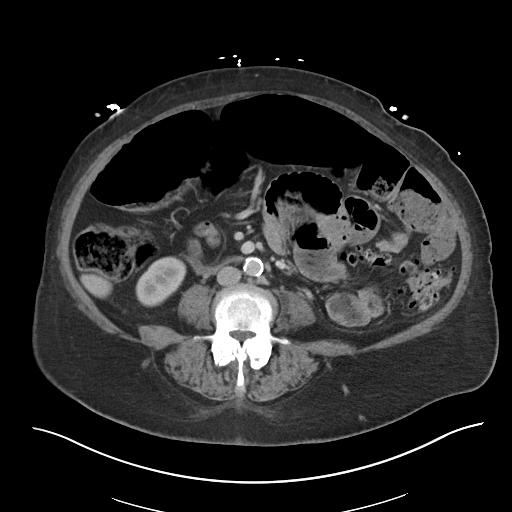
[im 57/99  soft-tissue]
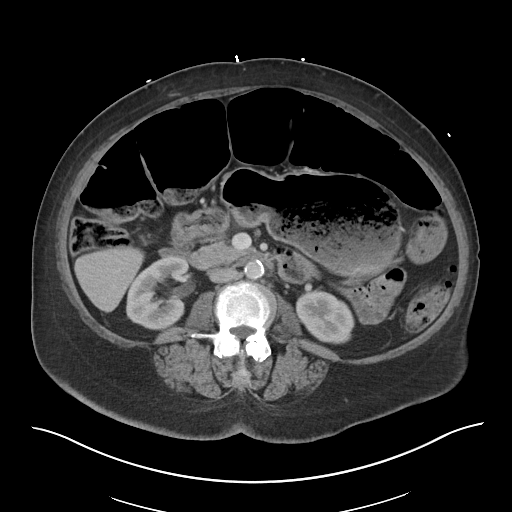
[im 64/99  soft-tissue]
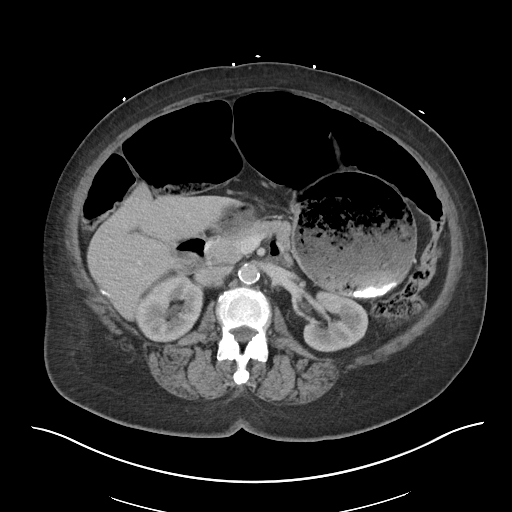
[im 78/99  soft-tissue]
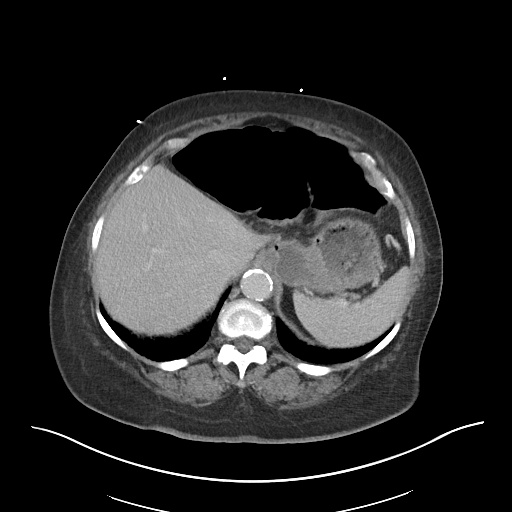
[im 78/99  bone]
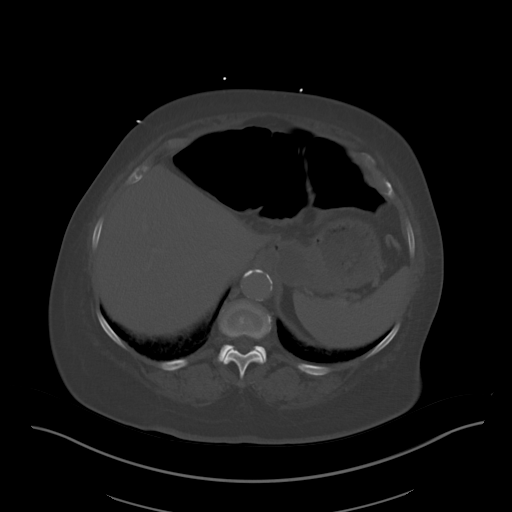
[im 85/99  soft-tissue]
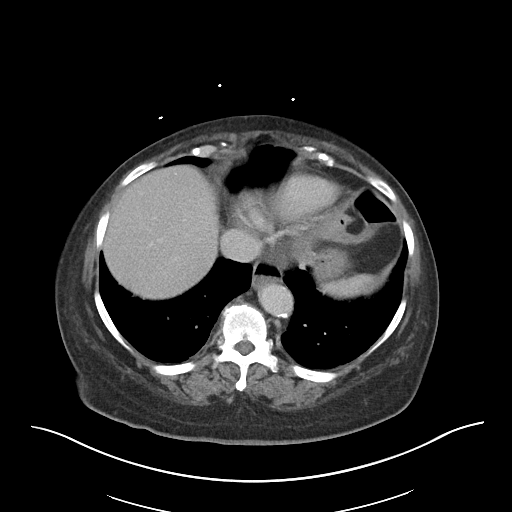
[im 92/99  soft-tissue]
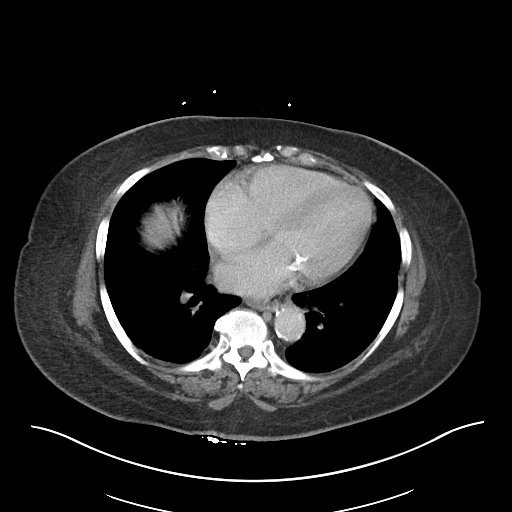

[Series 5: abdomen 3.0 mpr cor · coronal · 0.95mm/px · 3 of 117 slices shown]
[im 39/117  soft-tissue]
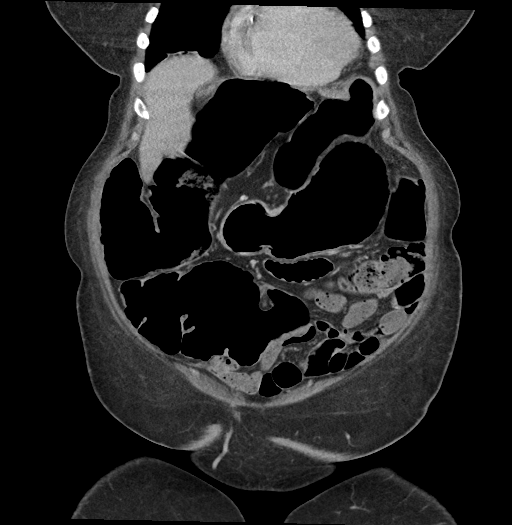
[im 52/117  soft-tissue]
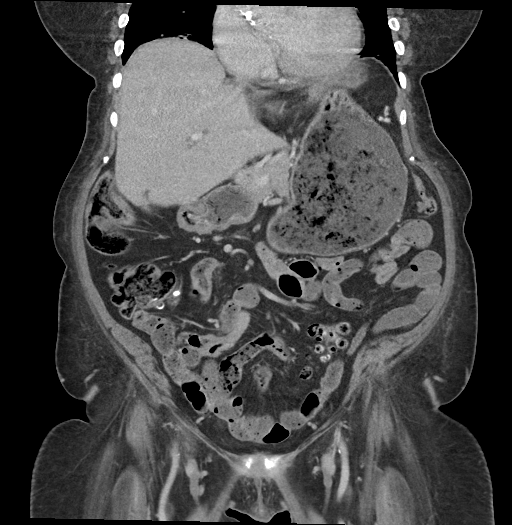
[im 65/117  soft-tissue]
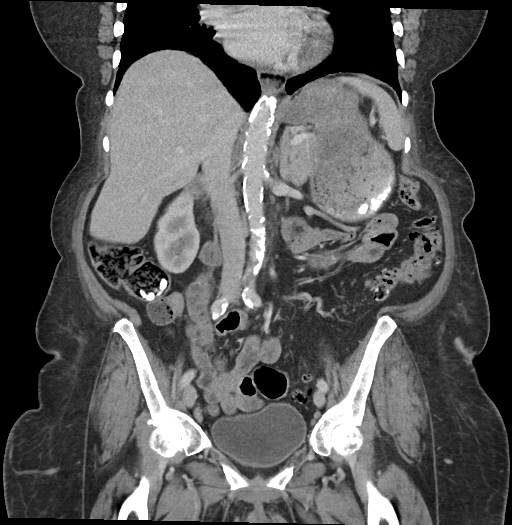

[14 of 46 positions shown; findings below may reference images not displayed]

RADIATION DOSE REDUCTION: This exam was performed according to the
departmental dose-optimization program which includes automated
exposure control, adjustment of the mA and/or kV according to
patient size and/or use of iterative reconstruction technique.

CONTRAST:  100mL OMNIPAQUE IOHEXOL 300 MG/ML  SOLN
FINDINGS: Lower chest: Cylindrical bronchiectasis in both lower lobes.
Atelectasis or scarring in the lingula and posterior basal segments
of both lower lobes. Aortic and mitral valve calcifications.
Thoracic aortic and right coronary artery atherosclerotic
calcification. Mild cardiomegaly.

Hepatobiliary: Contracted gallbladder. No focal hepatic parenchymal
lesion identified. No biliary dilatation noted.

Pancreas: Unremarkable

Spleen: Unremarkable

Adrenals/Urinary Tract: Unremarkable

Stomach/Bowel: Mildly dilated distal esophagus with an air-fluid
level. Cannot exclude esophageal reflux or dysmotility.

Substantial to descending and sigmoid colon diverticulosis without
findings of active diverticulitis.

Gaseous prominence of the mobile cecum, ascending colon, and
transverse colon with transition to smaller caliber colon in the
splenic flexure, but without an obvious mass lesion in this region.
The transverse colon extends substantially anterior to the liver and
stomach. Redundant transverse colon. Multiple clustered
hyperdensities in the ascending colon, possibly pill fragments. Mild
gaseous prominence of the distal most terminal ileum with some
frothy stool-like material also in the distal ileum. Normal
appendix.

Looking back at the scout image for the CT chest from 11/10/2020, we
similarly see an appearance of substantial gaseous prominence of the
upper colon. Accordingly this is not thought to be acute.

Several air-fluid levels are present in nondilated loops of left
upper quadrant small bowel for example on image 87 series 6.

Vascular/Lymphatic: Atherosclerosis is present, including aortoiliac
atherosclerotic disease. There is some atheromatous plaque
proximally in the SMA and celiac trunk without complete occlusion of
either vessel.

Upper normal size 0.9 cm left common iliac lymph node on image 54
series 2.

Right external iliac node is enlarged at 1.3 cm short axis on image
78 of series 2. Other right common iliac lymph nodes are likewise
enlarged.

A left common iliac node measures 0.8 cm in short axis on image 72
series 2, upper normal size.

Reproductive: 3.6 by 2.2 cm simple appearing fluid density lesion of
the right ovary/adnexa, image 76 series 2. Uterus absent.

Other: No supplemental non-categorized findings.

Musculoskeletal: Lower lumbar spondylosis and degenerative disc
disease contributing to bilateral foraminal impingement at L3-4 and
probable left foraminal impingement at L4-5. Suspected central
narrowing of the thecal sac at L3-4 and L4-5.
IMPRESSION: 1. Sigmoid diverticulosis but no active diverticulitis identified.
2. Dilated appearance of the ascending and transverse colon although
a similar appearance is shown on prior exams including the scout
image from CT chest dated 11/10/2020. This may represent a
functional colonic dilatation, this tapers in the vicinity of the
splenic flexure but without an obvious mass or mechanical cause for
the tapering. There is also some mild caliber prominence of the
terminal ileum.
3. Mildly dilated distal esophagus with air-fluid level, cannot
exclude reflux or dysmotility.
4. Mildly enlarged right external iliac lymph nodes, reactive versus
occult neoplastic process.
5. 3.6 by 2.2 cm simple appearing right ovarian/adnexal lesion.
Recommend follow-up US in 6-12 months. Note: This recommendation
does not apply to premenarchal patients and to those with increased
risk (genetic, family history, elevated tumor markers or other
high-risk factors) of ovarian cancer. Reference: JACR [DATE]):[id number]
6. Other imaging findings of potential clinical significance:
Cylindrical bronchiectasis in both lower lobes. Mild scarring in
both lung bases. Aortic and mitral valve calcifications. Aortic
Atherosclerosis (99IUT-EU6.6). Coronary atherosclerosis. Mild
cardiomegaly. Lower lumbar impingement.

## 2024-02-09 IMAGING — CT CTA GI BLEED
2 of 17 series · 9 of 46 positions shown, 15 images · IV contrast (agent unspecified)
Comparison: 03/31/2021 CT abdomen/pelvis.

CLINICAL DATA: Inpatient.  Rectal bleed.

EXAM:
CTA ABDOMEN AND PELVIS WITHOUT AND WITH CONTRAST
TECHNIQUE: Multidetector CT imaging of the abdomen and pelvis was performed
using the standard protocol during bolus administration of
intravenous contrast. Multiplanar reconstructed images and MIPs were
obtained and reviewed to evaluate the vascular anatomy.

[Series 11: cor · coronal · 0.75mm/px · 1 of 151 slices shown, 2 images]
[im 76/151  soft-tissue]
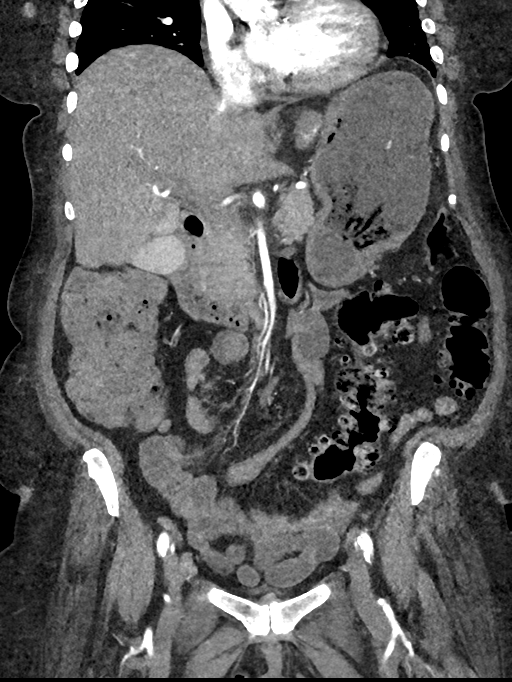
[im 76/151  bone]
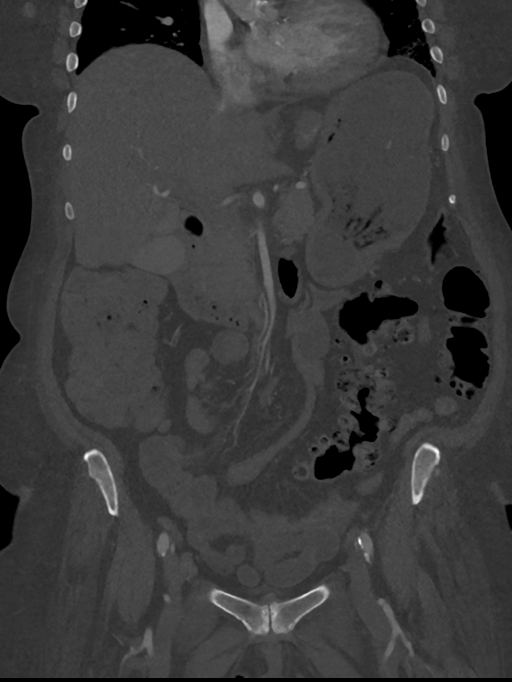

[Series 17: venous thins · axial · portal-venous · 0.84mm/px · z∈[-908,-502]mm · 8 of 1272 slices shown, 13 images]
[im 128/1272  soft-tissue]
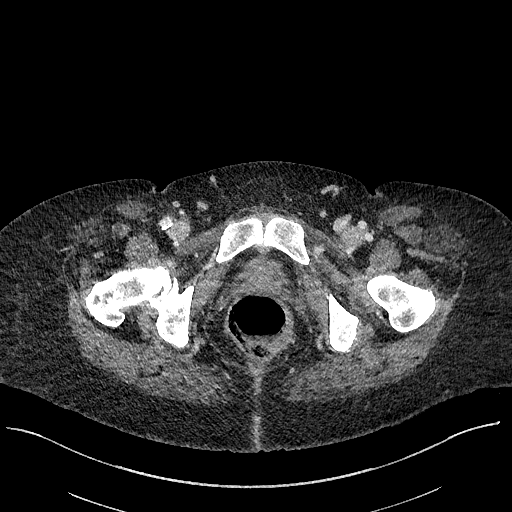
[im 128/1272  bone]
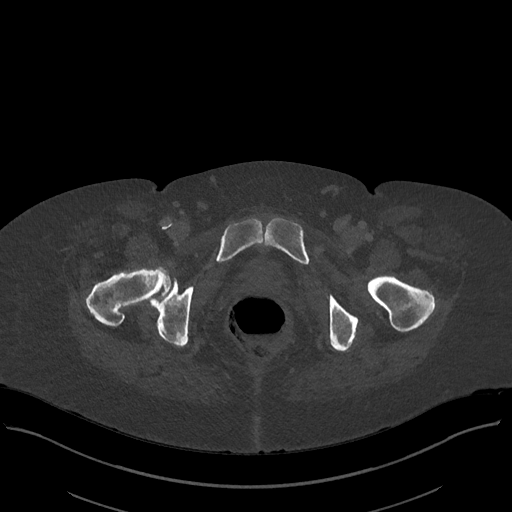
[im 255/1272  soft-tissue]
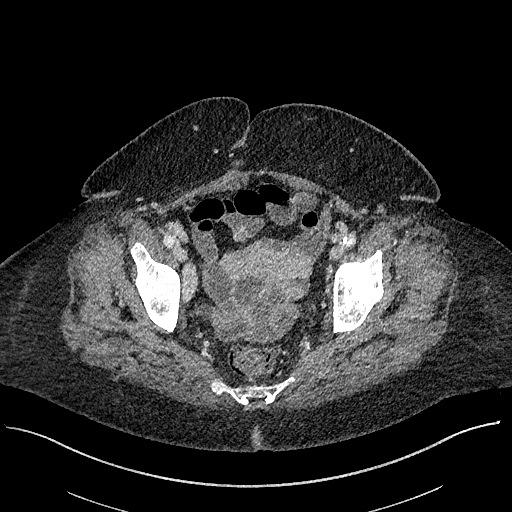
[im 382/1272  soft-tissue]
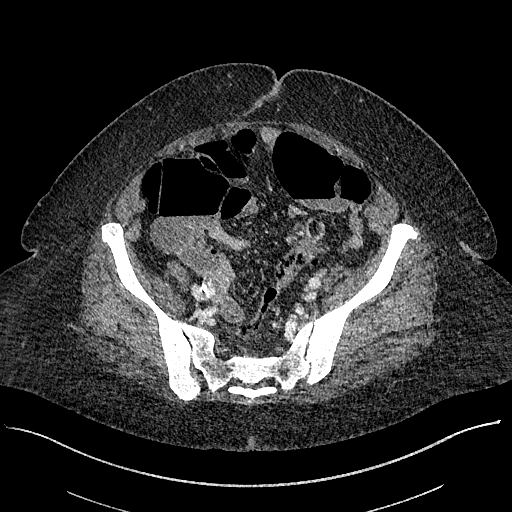
[im 509/1272  soft-tissue]
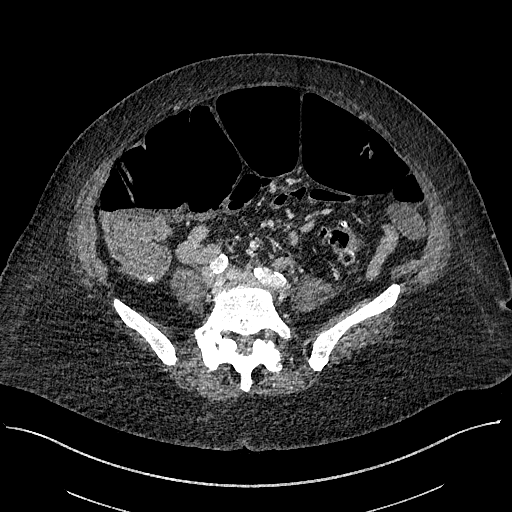
[im 763/1272  soft-tissue]
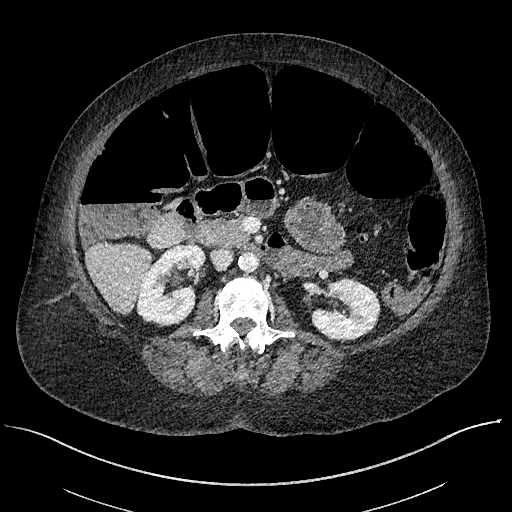
[im 763/1272  lung]
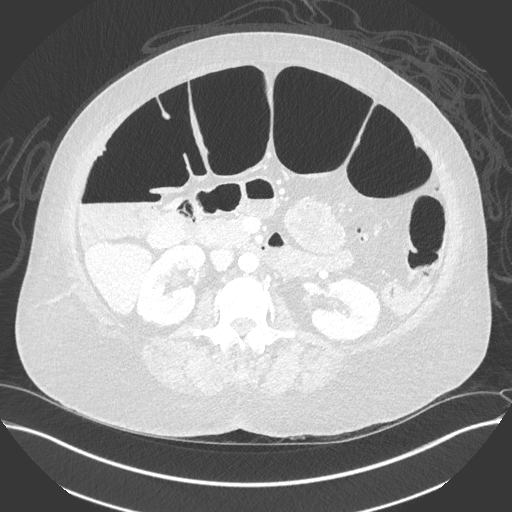
[im 890/1272  soft-tissue]
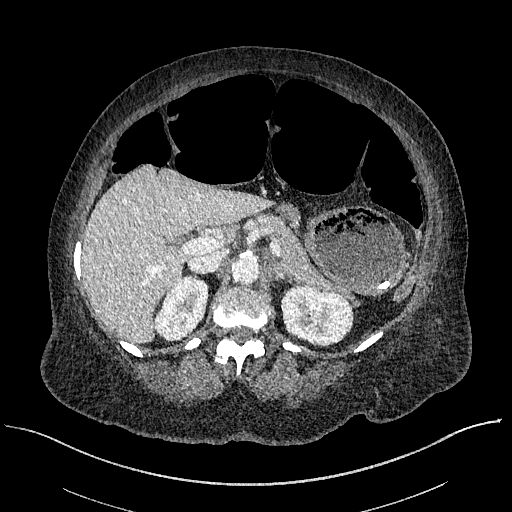
[im 890/1272  lung]
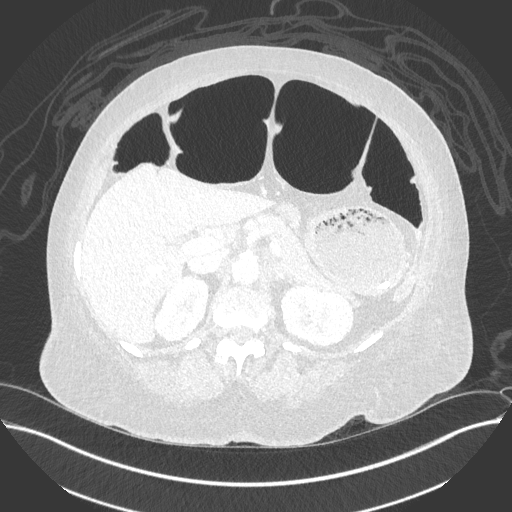
[im 1017/1272  soft-tissue]
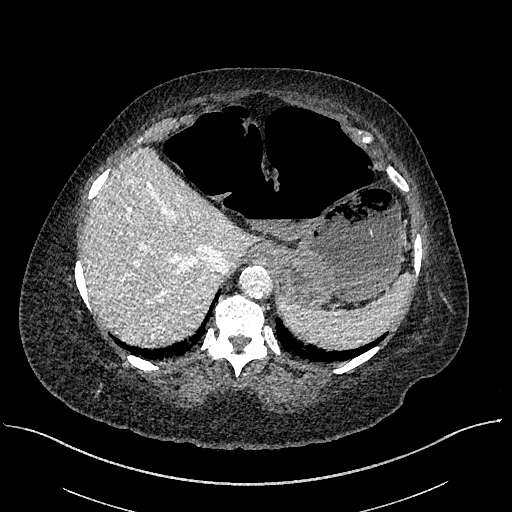
[im 1017/1272  lung]
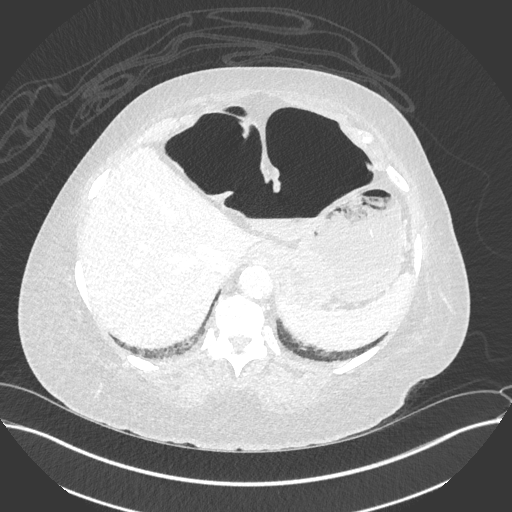
[im 1144/1272  soft-tissue]
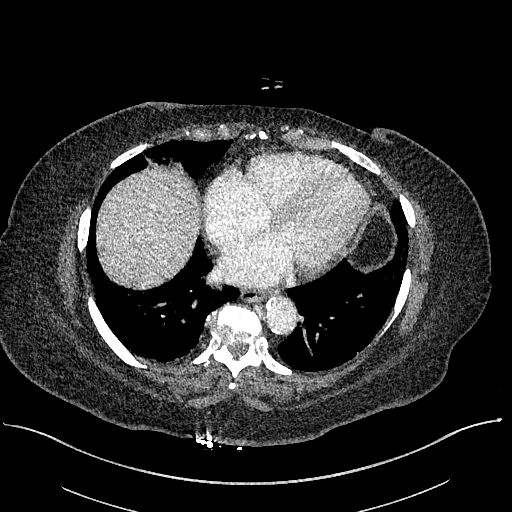
[im 1144/1272  lung]
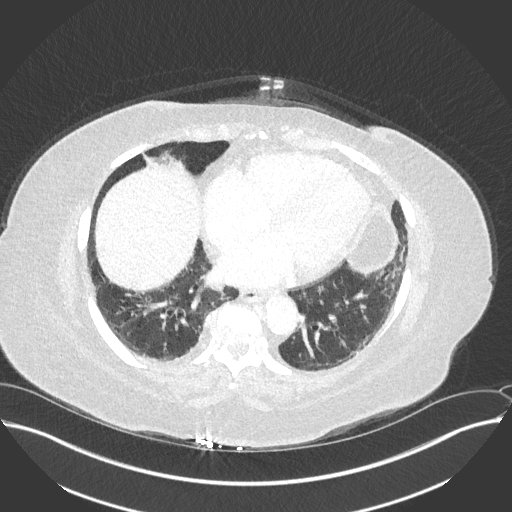

[9 of 46 positions shown; findings below may reference images not displayed]

RADIATION DOSE REDUCTION: This exam was performed according to the
departmental dose-optimization program which includes automated
exposure control, adjustment of the mA and/or kV according to
patient size and/or use of iterative reconstruction technique.

CONTRAST:  100mL OMNIPAQUE IOHEXOL 350 MG/ML SOLN
FINDINGS: Review of the MIP images confirms the above findings.

Lower chest: Patchy subpleural reticulation, ground-glass opacity
and mild traction bronchiectasis with subpleural lines of both lung
bases, not appreciably changed. No acute abnormality at the lung
bases. Coronary atherosclerosis. Mild cardiomegaly.

Hepatobiliary: Normal liver with no liver mass. Normal gallbladder
with no radiopaque cholelithiasis. No biliary ductal dilatation.

Pancreas: Normal, with no mass or duct dilation.

Spleen: Normal size. No mass.

Adrenals/Urinary Tract: Normal adrenals. Normal kidneys with no
hydronephrosis and no renal mass. No renal stones. Normal bladder.

Stomach/Bowel: Normal non-distended stomach. Normal caliber small
bowel with no small bowel wall thickening. Normal appendix. Marked
gaseous distention of the right and transverse colon. Marked left
colonic diverticulosis with no large bowel wall thickening or
significant pericolonic fat stranding. Apparent undigested pill
fragments layering in the right colon. No evidence of active
intraluminal contrast extravasation in the small or large bowel.

Vascular/Lymphatic: Atherosclerotic nonaneurysmal abdominal aorta.
Patent portal, splenic, hepatic and renal veins. Mild bilateral
common iliac and bilateral external iliac lymphadenopathy with
representative 1.0 cm short axis diameter left common iliac node
(series 16/image 143) and representative 1.3 cm right external iliac
node (series 16/image 203), stable. No pathologically enlarged
abdominal nodes.

Reproductive: Status post hysterectomy, with no abnormal findings at
the vaginal cuff. Simple 3.1 cm right adnexal cyst (series 16/image
196), stable. No left adnexal mass.

Other: No pneumoperitoneum, ascites or focal fluid collection.

Musculoskeletal: No aggressive appearing focal osseous lesions. Mild
thoracolumbar spondylosis.
IMPRESSION: 1. No evidence of active intraluminal hemorrhage in the small or
large bowel.
2. Marked left colonic diverticulosis.
3. Marked gaseous distention of the right and transverse colon
without bowel wall thickening or bowel obstruction, suggesting
adynamic ileus.
4. Stable mild bilateral common iliac and bilateral external iliac
lymphadenopathy, nonspecific. Follow-up CT abdomen/pelvis with oral
IV contrast recommended in 3 months. This recommendation follows ACR
consensus guidelines: White Paper of the ACR Incidental Findings
Committee II on Splenic and Nodal Findings. [HOSPITAL]
3955;[DATE].
5. Stable nonspecific chronic interstitial lung disease at the lung
bases.
6. Mild cardiomegaly. Coronary atherosclerosis.
7. Simple 3.1 cm right adnexal cyst, stable. Recommend follow-up US
in 6-12 months. Note: This recommendation does not apply to
premenarchal patients and to those with increased risk (genetic,
family history, elevated tumor markers or other high-risk factors)
of ovarian cancer. Reference: JACR [DATE]):248-254
8. Aortic Atherosclerosis ([id number]).
